# Patient Record
Sex: Female | Born: 1955 | Race: White | Hispanic: No | Marital: Married | Smoking: Never smoker
Health system: Southern US, Community
[De-identification: ages and names within clinical notes are randomized; demographics above are authoritative.]

## PROBLEM LIST (undated history)

## (undated) DIAGNOSIS — F419 Anxiety disorder, unspecified: Secondary | ICD-10-CM

## (undated) DIAGNOSIS — R600 Localized edema: Secondary | ICD-10-CM

## (undated) DIAGNOSIS — E119 Type 2 diabetes mellitus without complications: Secondary | ICD-10-CM

## (undated) DIAGNOSIS — R011 Cardiac murmur, unspecified: Secondary | ICD-10-CM

## (undated) DIAGNOSIS — G510 Bell's palsy: Secondary | ICD-10-CM

## (undated) DIAGNOSIS — R350 Frequency of micturition: Secondary | ICD-10-CM

## (undated) DIAGNOSIS — F4310 Post-traumatic stress disorder, unspecified: Secondary | ICD-10-CM

## (undated) DIAGNOSIS — I219 Acute myocardial infarction, unspecified: Secondary | ICD-10-CM

## (undated) DIAGNOSIS — C50412 Malignant neoplasm of upper-outer quadrant of left female breast: Principal | ICD-10-CM

## (undated) DIAGNOSIS — J189 Pneumonia, unspecified organism: Secondary | ICD-10-CM

## (undated) DIAGNOSIS — G4730 Sleep apnea, unspecified: Secondary | ICD-10-CM

## (undated) DIAGNOSIS — I10 Essential (primary) hypertension: Secondary | ICD-10-CM

## (undated) DIAGNOSIS — E785 Hyperlipidemia, unspecified: Secondary | ICD-10-CM

## (undated) DIAGNOSIS — M1990 Unspecified osteoarthritis, unspecified site: Secondary | ICD-10-CM

## (undated) DIAGNOSIS — F329 Major depressive disorder, single episode, unspecified: Secondary | ICD-10-CM

## (undated) DIAGNOSIS — F32A Depression, unspecified: Secondary | ICD-10-CM

## (undated) DIAGNOSIS — K219 Gastro-esophageal reflux disease without esophagitis: Secondary | ICD-10-CM

## (undated) HISTORY — DX: Hyperlipidemia, unspecified: E78.5

## (undated) HISTORY — DX: Unspecified osteoarthritis, unspecified site: M19.90

## (undated) HISTORY — DX: Depression, unspecified: F32.A

## (undated) HISTORY — PX: FRACTURE SURGERY: SHX138

## (undated) HISTORY — DX: Gastro-esophageal reflux disease without esophagitis: K21.9

## (undated) HISTORY — DX: Essential (primary) hypertension: I10

## (undated) HISTORY — DX: Major depressive disorder, single episode, unspecified: F32.9

## (undated) HISTORY — PX: COLONOSCOPY: SHX174

## (undated) HISTORY — DX: Anxiety disorder, unspecified: F41.9

## (undated) HISTORY — DX: Malignant neoplasm of upper-outer quadrant of left female breast: C50.412

---

## 2007-12-30 ENCOUNTER — Ambulatory Visit: Payer: Self-pay | Admitting: Internal Medicine

## 2007-12-30 DIAGNOSIS — F418 Other specified anxiety disorders: Secondary | ICD-10-CM

## 2007-12-30 DIAGNOSIS — I10 Essential (primary) hypertension: Secondary | ICD-10-CM

## 2007-12-30 DIAGNOSIS — R21 Rash and other nonspecific skin eruption: Secondary | ICD-10-CM

## 2007-12-30 DIAGNOSIS — J309 Allergic rhinitis, unspecified: Secondary | ICD-10-CM | POA: Insufficient documentation

## 2007-12-31 LAB — CONVERTED CEMR LAB
BUN: 15 mg/dL (ref 6–23)
Basophils Absolute: 0 10*3/uL (ref 0.0–0.1)
Calcium: 9.3 mg/dL (ref 8.4–10.5)
Direct LDL: 143.2 mg/dL
Eosinophils Relative: 1.9 % (ref 0.0–5.0)
HCT: 42.3 % (ref 36.0–46.0)
HDL: 53.1 mg/dL (ref >39.0)
Hemoglobin: 14 g/dL (ref 12.0–15.0)
Leukocytes, UA: NEGATIVE
Lymphocytes Relative: 39.9 % (ref 12.0–46.0)
MCHC: 33.2 g/dL (ref 30.0–36.0)
MCV: 91.4 fL (ref 78.0–100.0)
Monocytes Relative: 7.5 % (ref 3.0–11.0)
Neutrophils Relative %: 50.3 % (ref 43.0–77.0)
Potassium: 3 meq/L — ABNORMAL LOW (ref 3.5–5.1)
RBC: 4.62 M/uL (ref 3.87–5.11)
RDW: 13.3 % (ref 11.5–14.6)
Sodium: 139 meq/L (ref 135–145)
Specific Gravity, Urine: >=1.03 (ref 1.000–1.03)
TSH: 2.72 microintl units/mL (ref 0.35–5.50)
Total Bilirubin: 0.8 mg/dL (ref 0.3–1.2)
Total Protein, Urine: NEGATIVE mg/dL
Triglycerides: 336 mg/dL (ref 0–149)
Urine Glucose: NEGATIVE mg/dL
VLDL: 67 mg/dL — ABNORMAL HIGH (ref 0–40)
pH: 6 (ref 5.0–8.0)

## 2008-01-01 ENCOUNTER — Encounter: Payer: Self-pay | Admitting: Internal Medicine

## 2008-01-01 DIAGNOSIS — E559 Vitamin D deficiency, unspecified: Secondary | ICD-10-CM

## 2008-01-06 LAB — CONVERTED CEMR LAB: Vit D, 1,25-Dihydroxy: 19 — ABNORMAL LOW (ref 30–89)

## 2008-01-08 ENCOUNTER — Telehealth: Payer: Self-pay | Admitting: Internal Medicine

## 2008-01-09 ENCOUNTER — Ambulatory Visit: Payer: Self-pay | Admitting: Family Medicine

## 2008-01-09 DIAGNOSIS — J069 Acute upper respiratory infection, unspecified: Secondary | ICD-10-CM | POA: Insufficient documentation

## 2008-01-09 DIAGNOSIS — J019 Acute sinusitis, unspecified: Secondary | ICD-10-CM

## 2008-01-14 ENCOUNTER — Telehealth: Payer: Self-pay | Admitting: Internal Medicine

## 2008-01-18 ENCOUNTER — Encounter: Payer: Self-pay | Admitting: Internal Medicine

## 2008-01-18 ENCOUNTER — Telehealth: Payer: Self-pay | Admitting: Internal Medicine

## 2008-02-02 ENCOUNTER — Encounter: Payer: Self-pay | Admitting: Internal Medicine

## 2008-05-04 ENCOUNTER — Encounter: Payer: Self-pay | Admitting: Internal Medicine

## 2008-09-25 ENCOUNTER — Emergency Department (HOSPITAL_COMMUNITY): Admission: EM | Admit: 2008-09-25 | Discharge: 2008-09-25 | Payer: Self-pay | Admitting: Emergency Medicine

## 2008-12-23 ENCOUNTER — Inpatient Hospital Stay (HOSPITAL_COMMUNITY): Admission: EM | Admit: 2008-12-23 | Discharge: 2008-12-28 | Payer: Self-pay | Admitting: Emergency Medicine

## 2008-12-23 HISTORY — PX: ANKLE FRACTURE SURGERY: SHX122

## 2008-12-23 HISTORY — PX: ORIF TIBIA & FIBULA FRACTURES: SHX2131

## 2008-12-26 ENCOUNTER — Encounter (INDEPENDENT_AMBULATORY_CARE_PROVIDER_SITE_OTHER): Payer: Self-pay | Admitting: Internal Medicine

## 2008-12-26 ENCOUNTER — Ambulatory Visit: Payer: Self-pay | Admitting: Vascular Surgery

## 2010-04-11 HISTORY — PX: LAPAROSCOPIC CHOLECYSTECTOMY: SUR755

## 2010-04-25 ENCOUNTER — Ambulatory Visit (HOSPITAL_COMMUNITY): Admission: RE | Admit: 2010-04-25 | Discharge: 2010-04-25 | Payer: Self-pay | Admitting: Family Medicine

## 2010-04-25 ENCOUNTER — Ambulatory Visit: Payer: Self-pay | Admitting: Gastroenterology

## 2010-04-25 ENCOUNTER — Inpatient Hospital Stay (HOSPITAL_COMMUNITY): Admission: AD | Admit: 2010-04-25 | Discharge: 2010-04-28 | Payer: Self-pay

## 2010-04-25 ENCOUNTER — Ambulatory Visit: Payer: Self-pay | Admitting: Family Medicine

## 2010-04-25 DIAGNOSIS — R5381 Other malaise: Secondary | ICD-10-CM

## 2010-04-25 DIAGNOSIS — E785 Hyperlipidemia, unspecified: Secondary | ICD-10-CM | POA: Insufficient documentation

## 2010-04-25 DIAGNOSIS — R5383 Other fatigue: Secondary | ICD-10-CM

## 2010-04-25 DIAGNOSIS — R7303 Prediabetes: Secondary | ICD-10-CM

## 2010-04-25 DIAGNOSIS — R1011 Right upper quadrant pain: Secondary | ICD-10-CM | POA: Insufficient documentation

## 2010-04-27 ENCOUNTER — Encounter: Payer: Self-pay | Admitting: Physician Assistant

## 2010-04-27 ENCOUNTER — Encounter (INDEPENDENT_AMBULATORY_CARE_PROVIDER_SITE_OTHER): Payer: Self-pay | Admitting: Internal Medicine

## 2010-04-28 ENCOUNTER — Encounter: Payer: Self-pay | Admitting: Physician Assistant

## 2010-05-01 ENCOUNTER — Encounter: Payer: Self-pay | Admitting: Physician Assistant

## 2010-05-07 ENCOUNTER — Encounter: Payer: Self-pay | Admitting: Physician Assistant

## 2010-05-07 ENCOUNTER — Ambulatory Visit: Payer: Self-pay | Admitting: Family Medicine

## 2010-05-07 DIAGNOSIS — R748 Abnormal levels of other serum enzymes: Secondary | ICD-10-CM | POA: Insufficient documentation

## 2010-05-10 ENCOUNTER — Encounter: Payer: Self-pay | Admitting: Physician Assistant

## 2010-05-10 LAB — CONVERTED CEMR LAB
ALT: 12 units/L (ref 0–35)
Alkaline Phosphatase: 112 units/L (ref 39–117)
Indirect Bilirubin: 0.2 mg/dL (ref 0.0–0.9)
Total Protein: 7.6 g/dL (ref 6.0–8.3)

## 2010-06-27 ENCOUNTER — Encounter: Payer: Self-pay | Admitting: Gastroenterology

## 2010-07-24 ENCOUNTER — Ambulatory Visit: Payer: Self-pay | Admitting: Family Medicine

## 2010-07-24 DIAGNOSIS — N950 Postmenopausal bleeding: Secondary | ICD-10-CM

## 2010-08-30 ENCOUNTER — Encounter: Payer: Self-pay | Admitting: Family Medicine

## 2010-10-19 ENCOUNTER — Telehealth: Payer: Self-pay | Admitting: Family Medicine

## 2010-10-23 ENCOUNTER — Ambulatory Visit: Payer: Self-pay | Admitting: Family Medicine

## 2010-11-19 ENCOUNTER — Ambulatory Visit
Admission: RE | Admit: 2010-11-19 | Discharge: 2010-11-19 | Payer: Self-pay | Source: Home / Self Care | Attending: Family Medicine | Admitting: Family Medicine

## 2010-11-19 DIAGNOSIS — J018 Other acute sinusitis: Secondary | ICD-10-CM | POA: Insufficient documentation

## 2010-11-19 DIAGNOSIS — J329 Chronic sinusitis, unspecified: Secondary | ICD-10-CM | POA: Insufficient documentation

## 2010-11-19 DIAGNOSIS — J209 Acute bronchitis, unspecified: Secondary | ICD-10-CM | POA: Insufficient documentation

## 2010-11-22 ENCOUNTER — Telehealth: Payer: Self-pay | Admitting: Family Medicine

## 2010-12-02 ENCOUNTER — Encounter: Payer: Self-pay | Admitting: Family Medicine

## 2010-12-11 NOTE — Assessment & Plan Note (Signed)
Summary: NEW PATIENT- room 2   Vital Signs:  Patient profile:   55 year old female Height:      65.75 inches Weight:      214.50 pounds BMI:     35.01 O2 Sat:      98 % on Room air Pulse rate:   69 / minute Resp:     16 per minute BP sitting:   150 / 90  (left arm)  Vitals Entered By: Adella Hare LPN (April 25, 2010 8:40 AM) CC: new patient Is Patient Diabetic? No Pain Assessment Patient in pain? yes     Location: right side pain Intensity: 2 Type: aching Onset of pain  Constant   CC:  new patient.  History of Present Illness: New pt here to establish care with new PCP.  Pt states she has had intermittent pain x 1 yr RUQ of abd.  Had a flare up last night and is still painful today. Though not as bad as last night.   Was feverish, & chills.  Decreased appetite and nauseated during the attacks.  BM's nl.  No increase in flatulance  Hx of htn. Taking medications as prescribed. No chest pain or palpitations. BP at home running little higher than previous for the last couple of mos.  At home 130's over upper 80's 90's.  Has been getting some HA's again.  Hx of depression.  Wellbutrin not working well.  Cymbalta was better but had sexual side effects.  Lexapro seemed to work the best but uncertain why was changed to Cymbalta.  Uses Xanax as needed  at bedtime only.  She does not take it every night.  Hx of seasonal allergies.  Allegra did not help.  Sxs have improved recently.  Last physical and labs approx 1 yr ago.  Has been on HRT x approx 1 yr. Aware of the risks.  Discussed with pt that we may want to change her to topical estrogen.  Will discuss this more when she comes in for her physical.  Last pap & mamm approx 3 yrs ago.  Pt states her blood sugar tends to run around 140.    Current Medications (verified): 1)  Atenolol 50 Mg Tabs (Atenolol) .... One Tab By Mouth Once Daily 2)  Bupropion Hcl 150 Mg Xr24h-Tab (Bupropion Hcl) .... One Tab By Mouth Two Times A Day 3)   Alprazolam 0.5 Mg Tabs (Alprazolam) .... One Tab By Mouth At Bedtime As Needed For Sleep 4)  Aspir-Low 81 Mg Tbec (Aspirin) .... One Tab By Mouth Once Daily 5)  Prempro 0.3-1.5 Mg Tabs (Conj Estrog-Medroxyprogest Ace) .... One Tab By Mouth Once Daily  Allergies (verified): 1)  Cymbalta (Duloxetine Hcl)  Past History:  Past medical, surgical, family and social histories (including risk factors) reviewed, and no changes noted (except as noted below).  Past Medical History: Reviewed history from 12/30/2007 and no changes required. Allergic rhinitis Depression Hypertension H. Zoster  Past Surgical History: 12/23/08 broken leg- had to be pinned  Family History: Reviewed history from 12/30/2007 and no changes required. mother deceased-lung cancer father deceased- MVA, alcoholic two sisters living- Htnx1, depression x2 three brothers living- Heart dzx 2, Htn x1, DM x1 one brother deceased- esoph cancer  Social History: Reviewed history from 12/30/2007 and no changes required. 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 ENT:  Denies nasal congestion and sore throat. CV:  Denies chest pain or discomfort and palpitations. Resp:  Denies cough and shortness of breath. GI:  Complains of abdominal pain, loss of appetite, and nausea; denies change in bowel habits, indigestion, and vomiting. GU:  Denies abnormal vaginal bleeding, dysuria, and urinary frequency. Psych:  Complains of anxiety and depression; denies suicidal thoughts/plans and thoughts /plans of harming others.  Physical Exam  General:  Well-developed,well-nourished,in no acute distress; alert,appropriate and cooperative throughout examination Head:  Normocephalic and atraumatic without obvious abnormalities. No apparent alopecia or  balding. Ears:  External ear exam shows no significant lesions or deformities.  Otoscopic examination reveals clear canals, tympanic membranes are intact bilaterally without bulging, retraction, inflammation or discharge. Hearing is grossly normal bilaterally. Nose:  External nasal examination shows no deformity or inflammation. Nasal mucosa are pink and moist without lesions or exudates. Mouth:  Oral mucosa and oropharynx without lesions or exudates.  Teeth in good repair. Neck:  No deformities, masses, or tenderness noted. Lungs:  Normal respiratory effort, chest expands symmetrically. Lungs are clear to auscultation, no crackles or wheezes. Heart:  Normal rate and regular rhythm. S1 and S2 normal without gallop, murmur, click, rub or other extra sounds. Abdomen:  soft, normal bowel sounds, no distention, no masses, no abdominal hernia, no hepatomegaly, and no splenomegaly.  Pt is very TTP RUQ at CVA with guarding. Cervical Nodes:  No lymphadenopathy noted Psych:  Cognition and judgment appear intact. Alert and cooperative with normal attention span and concentration. No apparent delusions, illusions, hallucinationsnot anxious appearing and not depressed appearing.     Impression & Recommendations:  Problem # 1:  ABDOMINAL PAIN, RIGHT UPPER QUADRANT (ICD-789.01) Assessment New Discussed pain is probably gallbladder.  To avoid greasy or fatty foods.  Light bland diet.  Abd [place] scheduled for today. Advised pt that if her pain worsens she will need to go to ER.  Orders: T-CBC w/Diff ([id number]) T-Comprehensive Metabolic Panel ([id number]) Abdomen Ultrasound (Sono Abd)  Problem # 2:  HYPERTENSION (ICD-401.9) Assessment: Comment Only Will monitor.  BP is up today but pt is also having pain.  The following medications were removed from the medication list:    Atenolol-chlorthalidone 50-25 Mg Tabs (Atenolol-chlorthalidone) .Marland Kitchen... 1 by mouth every day Her updated medication list for this  problem includes:    Atenolol 50 Mg Tabs (Atenolol) ..... One tab by mouth once daily  Orders: T-Comprehensive Metabolic Panel ([id number])  BP today: 150/90 Prior BP: 110/70 (01/09/2008)  Labs Reviewed: K+: 3.0 (12/30/2007) Creat: : 0.9 (12/30/2007)   Chol: 278 (12/30/2007)   HDL: 53.1 (12/30/2007)   LDL: DEL (12/30/2007)   TG: 336 (12/30/2007)  Problem # 3:  DEPRESSION (ICD-311) Assessment: Deteriorated  Add Lexapro to Wellbutrin.  The following medications were removed from the medication list:    Celexa 20 Mg Tabs (Citalopram hydrobromide) .Marland Kitchen... 1 by mouth once daily. start with 1/2 tab a day x 1 wk.    Lorazepam 0.5 Mg Tabs (Lorazepam) .Marland Kitchen... 1 by mouth two times a day as needed anxiety Her updated medication list for this problem includes:    Bupropion Hcl 150 Mg Xr24h-tab (Bupropion hcl) ..... One tab by mouth two times a day    Alprazolam 0.5 Mg Tabs (Alprazolam) ..... One tab by mouth at bedtime as needed for sleep    Lexapro 10 Mg Tabs (Escitalopram oxalate) .Marland Kitchen... Take 1 daily  Problem # 4:  OBESITY (ICD-278.00) Assessment: Comment Only Encouraged exercise, healthy diet and  wt loss.  Complete Medication List: 1)  Atenolol 50 Mg Tabs (Atenolol) .... One tab by mouth once daily 2)  Bupropion Hcl 150 Mg Xr24h-tab (Bupropion hcl) .... One tab by mouth two times a day 3)  Alprazolam 0.5 Mg Tabs (Alprazolam) .... One tab by mouth at bedtime as needed for sleep 4)  Aspir-low 81 Mg Tbec (Aspirin) .... One tab by mouth once daily 5)  Prempro 0.3-1.5 Mg Tabs (Conj estrog-medroxyprogest ace) .... One tab by mouth once daily 6)  Lexapro 10 Mg Tabs (Escitalopram oxalate) .... Take 1 daily 7)  Hydrocodone-acetaminophen 5-500 Mg Tabs (Hydrocodone-acetaminophen) .... Take 1 every 4-6 hrs as needed for pain.  Other Orders: T-TSH [id number]) T-Lipid Profile [phone]) T- Hemoglobin A1C [phone]) T-Vitamin D (25-Hydroxy) [phone])  Patient Instructions: 1)  Follow up  appt for physical in 1-2 months. 2)  I have ordered blood work for you to have drawn this morning. 3)  I have ordered a ultrasound of your abdomen. 4)  I have added Lexapro. 5)  I have refilled your other medications. 6)  It is important that you exercise regularly at least 20 minutes 5 times a week. If you develop chest pain, have severe difficulty breathing, or feel very tired , stop exercising immediately and seek medical attention. 7)  You need to lose weight. Consider a lower calorie diet and regular exercise.  Prescriptions: HYDROCODONE-ACETAMINOPHEN 5-500 MG TABS (HYDROCODONE-ACETAMINOPHEN) take 1 every 4-6 hrs as needed for pain.  #30 x 0   Entered and Authorized by:   Esperanza Sheets PA   Signed by:   Esperanza Sheets PA on 04/25/2010   Method used:   Printed then faxed to ...       [facility] (retail)       [address]       [place], [place]  [address]       Ph: [phone]       Fax: [phone]   RxID:   [id number] ALPRAZOLAM 0.5 MG TABS (ALPRAZOLAM) one tab by mouth at bedtime as needed for sleep  #30 x 1   Entered and Authorized by:   Esperanza Sheets PA   Signed by:   Esperanza Sheets PA on 04/25/2010   Method used:   Printed then faxed to ...       [facility] (retail)       [address]       [place], [place]  [address]       Ph: [phone]       Fax: [phone]   RxID:   [id number] PREMPRO 0.3-1.5 MG TABS (CONJ ESTROG-MEDROXYPROGEST ACE) one tab by mouth once daily  #30 x 3   Entered and Authorized by:   Esperanza Sheets PA   Signed by:   Esperanza Sheets PA on 04/25/2010   Method used:   Electronically to        [facility] (retail)       [address]       [place], [place]  [address]       Ph: [phone]       Fax: [phone]   RxID:   [id number] BUPROPION HCL 150 MG XR24H-TAB (BUPROPION HCL) one tab by mouth two times a day  #60 x 3    Entered and Authorized by:   Esperanza Sheets PA   Signed  by:   Esperanza Sheets PA on 04/25/2010   Method used:   Electronically to        [facility] (retail)       [address]       [place], [place]  [address]       Ph: [phone]       Fax: [phone]   RxID:   [id number] ATENOLOL 50 MG TABS (ATENOLOL) one tab by mouth once daily  #30 x 3   Entered and Authorized by:   Esperanza Sheets PA   Signed by:   Esperanza Sheets PA on 04/25/2010   Method used:   Electronically to        [facility] (retail)       [address]       [place], [place]  [address]       Ph: [phone]       Fax: [phone]   RxID:   [id number] LEXAPRO 10 MG TABS (ESCITALOPRAM OXALATE) take 1 daily  #30 x 3   Entered and Authorized by:   Esperanza Sheets PA   Signed by:   Esperanza Sheets PA on 04/25/2010   Method used:   Electronically to        [facility] (retail)       [address]       [place], [place]  [address]       Ph: [phone]       Fax: [phone]   RxID:   [id number]

## 2010-12-11 NOTE — Letter (Signed)
Summary: Letter  Letter   Imported By: Lind Guest 05/11/2010 08:49:44  _____________________________________________________________________  External Attachment:    Type:   Image     Comment:   External Document

## 2010-12-11 NOTE — Letter (Signed)
Summary: Discharge Summary  Discharge Summary   Imported By: Lind Guest 05/02/2010 13:46:53  _____________________________________________________________________  External Attachment:    Type:   Image     Comment:   External Document

## 2010-12-11 NOTE — Letter (Signed)
Summary: RADIOLOGY REPORT [facility]  RADIOLOGY REPORT [facility]   Imported By: Rexene Alberts 06/27/2010 16:50:36  _____________________________________________________________________  External Attachment:    Type:   Image     Comment:   External Document

## 2010-12-11 NOTE — Assessment & Plan Note (Signed)
Summary: ov   Vital Signs:  Patient profile:   55 year old female Weight:      219.25 pounds O2 Sat:      98 % on Room air Pulse rate:   68 / minute BP sitting:   130 / 78  (left arm) CC: routine check up, had gall bladder surgery in June, also needs medication refills.  she has questions about hormonal supplement, she has started a period a week and a half ago,  room 3 Is Patient Diabetic? No   CC:  routine check up, had gall bladder surgery in June, also needs medication refills.  she has questions about hormonal supplement, she has started a period a week and a half ago, and room 3.  History of Present Illness: Had episode of spotting while inpt for gallbladder in June and was off HRT.  Stopped as soon as discharged from hosp & restarted HRT.  Had been at least a yr prior to that since had vag bleeding.  Started a period 1 1/2 weeks ago, heavy flow, some clots, and is still bleeding.  Increased appetite and hot flashes.  Changing protection every 2-3 hrs.  Has not missed HRT etc.  Also needs refill of her other meds. States she is doing very well emotionally with the Lexapro and Wellbutrin. Started chol medication in June.  Is due for labs. Hx of htn. Taking medications as prescribed and denies side effects.  No headache, chest pain or palpitations.       Allergies: 1)  Cymbalta (Duloxetine Hcl)  Past History:  PMH reviewed and updated.  Past Medical History: Allergic rhinitis Depression Hypertension H. Zoster Hyperlipidemia  Review of Systems General:  Denies chills and fever. CV:  Denies chest pain or discomfort and palpitations. Resp:  Denies shortness of breath. GI:  Complains of gas; denies abdominal pain, change in bowel habits, nausea, and vomiting. GU:  Complains of abnormal vaginal bleeding; denies dysuria and urinary frequency.  Physical Exam  General:  Well-developed,well-nourished,in no acute distress; alert,appropriate and cooperative throughout  examination Head:  Normocephalic and atraumatic without obvious abnormalities. No apparent alopecia or balding. Ears:  External ear exam shows no significant lesions or deformities.  Otoscopic examination reveals clear canals, tympanic membranes are intact bilaterally without bulging, retraction, inflammation or discharge. Hearing is grossly normal bilaterally. Nose:  External nasal examination shows no deformity or inflammation. Nasal mucosa are pink and moist without lesions or exudates. Mouth:  Oral mucosa and oropharynx without lesions or exudates.  Teeth in good repair. Neck:  No deformities, masses, or tenderness noted. Lungs:  Normal respiratory effort, chest expands symmetrically. Lungs are clear to auscultation, no crackles or wheezes. Heart:  Normal rate and regular rhythm. S1 and S2 normal without gallop, murmur, click, rub or other extra sounds. Abdomen:  Bowel sounds positive,abdomen soft and non-tender without masses, organomegaly or hernias noted. Cervical Nodes:  No lymphadenopathy noted Psych:  Cognition and judgment appear intact. Alert and cooperative with normal attention span and concentration. No apparent delusions, illusions, hallucinations   Impression & Recommendations:  Problem # 1:  POSTMENOPAUSAL BLEEDING (ICD-627.1) Assessment New  Her updated medication list for this problem includes:    Prempro 0.3-1.5 Mg Tabs (Conj estrog-medroxyprogest ace) ..... One tab by mouth once daily  Orders: Pelvic Ultrasound (Sono Pelvis) T-CBC No Diff ([id number]) T-TSH ([id number]) Gynecologic Referral (Gyn)  Problem # 2:  HYPERTENSION (ICD-401.9) Assessment: Comment Only  Her updated medication list for this problem includes:    Atenolol  50 Mg Tabs (Atenolol) ..... One tab by mouth once daily  Orders: T-Comprehensive Metabolic Panel ([id number])  BP today: 130/78 Prior BP: 100/68 (05/07/2010)  Labs Reviewed: K+: 3.0 (12/30/2007) Creat: : 0.9 (12/30/2007)   Chol:  278 (12/30/2007)   HDL: 53.1 (12/30/2007)   LDL: DEL (12/30/2007)   TG: 336 (12/30/2007)  Problem # 3:  HYPERLIPIDEMIA (ICD-272.4) Assessment: Comment Only  Her updated medication list for this problem includes:    Simvastatin 20 Mg Tabs (Simvastatin) ..... One tab by mouth at bedtime  Orders: T-Comprehensive Metabolic Panel [phone]) T-Lipid Profile [phone])  Labs Reviewed: SGOT: 13 (05/07/2010)   SGPT: 12 (05/07/2010)   HDL:53.1 (12/30/2007)  LDL:DEL (12/30/2007)  Chol:278 (12/30/2007)  Trig:336 (12/30/2007)  Problem # 4:  DEPRESSION (ICD-311) Assessment: Improved  Her updated medication list for this problem includes:    Bupropion Hcl 150 Mg Xr24h-tab (Bupropion hcl) ..... One tab by mouth two times a day    Alprazolam 0.5 Mg Tabs (Alprazolam) ..... One tab by mouth at bedtime as needed for sleep    Lexapro 10 Mg Tabs (Escitalopram oxalate) .Marland Kitchen... Take 1 daily  Complete Medication List: 1)  Atenolol 50 Mg Tabs (Atenolol) .... One tab by mouth once daily 2)  Bupropion Hcl 150 Mg Xr24h-tab (Bupropion hcl) .... One tab by mouth two times a day 3)  Alprazolam 0.5 Mg Tabs (Alprazolam) .... One tab by mouth at bedtime as needed for sleep 4)  Aspir-low 81 Mg Tbec (Aspirin) .... One tab by mouth once daily 5)  Prempro 0.3-1.5 Mg Tabs (Conj estrog-medroxyprogest ace) .... One tab by mouth once daily 6)  Lexapro 10 Mg Tabs (Escitalopram oxalate) .... Take 1 daily 7)  Simvastatin 20 Mg Tabs (Simvastatin) .... One tab by mouth at bedtime  Other Orders: T- Hemoglobin A1C ([id number]) T-Mammography Bilateral Screening ([address]) Influenza Vaccine NON MCR (60109)  Patient Instructions: 1)  Please schedule a follow-up appointment in 3 months. 2)  I have referred you to a gynecologist. 3)  I have ordered a mammogram for you. 4)  I have ordered a pelvic ultrasound. 5)  I have ordered blood work.  Have this drawn fasting. 6)  I have refilled your medications. 7)  You should take a  multivit without iron daily.  Such as Centrum silver, or comparable.   8)  You should also get 1200 mg of Calcium a day.  This can be from dietary intake or calcium supplements. 9)  It is important that you exercise regularly at least 20 minutes 5 times a week. If you develop chest pain, have severe difficulty breathing, or feel very tired , stop exercising immediately and seek medical attention. 10)  You need to lose weight. Consider a lower calorie diet and regular exercise.  Prescriptions: ALPRAZOLAM 0.5 MG TABS (ALPRAZOLAM) one tab by mouth at bedtime as needed for sleep  #30 x 1   Entered and Authorized by:   Esperanza Sheets PA   Signed by:   Esperanza Sheets PA on 07/24/2010   Method used:   Printed then faxed to ...       [facility] (retail)       [address]       [place], [place]  [address]       Ph: [phone]       Fax: [phone]   RxID:   [id number] SIMVASTATIN 20 MG TABS (SIMVASTATIN) one tab by mouth at bedtime  #30 x 3  Entered and Authorized by:   Esperanza Sheets PA   Signed by:   Esperanza Sheets PA on 07/24/2010   Method used:   Electronically to        [facility] (retail)       [address]       [place], [place]  [address]       Ph: [phone]       Fax: [phone]   RxID:   [id number] LEXAPRO 10 MG TABS (ESCITALOPRAM OXALATE) take 1 daily  #30 x 3   Entered and Authorized by:   Esperanza Sheets PA   Signed by:   Esperanza Sheets PA on 07/24/2010   Method used:   Electronically to        [facility] (retail)       [address]       [place], [place]  [address]       Ph: [phone]       Fax: [phone]   RxID:   [id number] PREMPRO 0.3-1.5 MG TABS (CONJ ESTROG-MEDROXYPROGEST ACE) one tab by mouth once daily  #30 x 3   Entered and Authorized by:   Esperanza Sheets PA   Signed by:   Esperanza Sheets PA on 07/24/2010   Method used:   Electronically  to        [facility] (retail)       [address]       [place], [place]  [address]       Ph: [phone]       Fax: [phone]   RxID:   [id number] BUPROPION HCL 150 MG XR24H-TAB (BUPROPION HCL) one tab by mouth two times a day  #60 x 3   Entered and Authorized by:   Esperanza Sheets PA   Signed by:   Esperanza Sheets PA on 07/24/2010   Method used:   Electronically to        [facility] (retail)       [address]       [place], [place]  [address]       Ph: [phone]       Fax: [phone]   RxID:   [id number] ATENOLOL 50 MG TABS (ATENOLOL) one tab by mouth once daily  #30 x 3   Entered and Authorized by:   Esperanza Sheets PA   Signed by:   Esperanza Sheets PA on 07/24/2010   Method used:   Electronically to        [facility] (retail)       [address]       [place], [place]  [address]       Ph: [phone]       Fax: [phone]   RxID:   [id number]    Influenza Vaccine    Vaccine Type: Fluvax Non-MCR    Site: left deltoid    Mfr: novartis    Dose: 0.5 ml    Route: IM    Given by: Adella Hare LPN    Exp. Date: 03/2011    Lot #: [id number]    VIS given: 06/05/10 version given July 24, 2010.

## 2010-12-11 NOTE — Assessment & Plan Note (Signed)
Summary: F UP FROM HOSPITAL- ROOM 2   Vital Signs:  Patient profile:   55 year old female Height:      65.75 inches Weight:      211 pounds O2 Sat:      98 % on Room air Pulse rate:   62 / minute Resp:     16 per minute BP sitting:   100 / 68  (left arm)  Vitals Entered By: Adella Hare LPN (May 07, 2010 9:46 AM) CC: FOLLOW UP Is Patient Diabetic? No Pain Assessment Patient in pain? no      Comments DID NOT BRING MEDS TO OV   CC:  FOLLOW UP.  History of Present Illness: Pt is here for hosp follow up.  She states she is feeling better. Appetite is good.  Abd pain/discomfort is improving.  She has been having some constipation, but discontinued her pain meds and seems to be improving.  She had hyperlipidemia noted while inpt.  Needs f/u liver enzymes before rxing meds. Pt restarted her Prempro this am.  She did have a menses while off of it. She has completed her antibiotic.  Pt has already noticed improvement with Lexapro in addition to Wellbutrin.  Has been on this for only 1 week.    Allergies (verified): 1)  Cymbalta (Duloxetine Hcl)  Past History:  Past medical, surgical, family and social histories (including risk factors) reviewed, and no changes noted (except as noted below).  Past Medical History: Reviewed history from 12/30/2007 and no changes required. Allergic rhinitis Depression Hypertension H. Zoster  Past Surgical History: 12/23/08 broken leg- had to be pinned Cholecystectomy 6/11  Family History: Reviewed history from 04/25/2010 and no changes required. mother deceased-lung cancer father deceased- MVA, alcoholic two sisters living- Htnx1, depression x2 three brothers living- Heart dzx 2, Htn x1, DM x1 one brother deceased- esoph cancer  Social History: Reviewed history from 04/25/2010 and no changes required. 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 CV:  Denies chest pain or discomfort and palpitations. Resp:  Denies shortness of breath. GI:  Complains of constipation; denies abdominal pain, loss of appetite, nausea, and vomiting. GU:  Complains of abnormal vaginal bleeding. Psych:  Complains of depression.  Physical Exam  General:  Well-developed,well-nourished,in no acute distress; alert,appropriate and cooperative throughout examination Head:  Normocephalic and atraumatic without obvious abnormalities. No apparent alopecia or balding. Ears:  External ear exam shows no significant lesions or deformities.  Otoscopic examination reveals clear canals, tympanic membranes are intact bilaterally without bulging, retraction, inflammation or discharge. Hearing is grossly normal bilaterally. Nose:  External nasal examination shows no deformity or inflammation. Nasal mucosa are pink and moist without lesions or exudates. Mouth:  Oral mucosa and oropharynx without lesions or exudates.  Teeth in good repair. Neck:  No deformities, masses, or tenderness noted. Lungs:  Normal respiratory effort, chest expands symmetrically. Lungs are clear to auscultation, no crackles or wheezes. Heart:  Normal rate and regular rhythm. S1 and S2 normal without gallop, murmur, click, rub or other extra sounds. Abdomen:  soft, non-tender, and no masses.  Her incision sites do appear to be healing well.  Minimal erythema at umbilical incision without drainage. Cervical Nodes:  No lymphadenopathy noted Psych:  Cognition and judgment appear intact. Alert and cooperative with normal attention span and concentration. No apparent delusions, illusions, hallucinations   Impression & Recommendations:  Problem #  1:  OTHER NONSPECIFIC ABNORMAL SERUM ENZYME LEVELS (ICD-790.5) Assessment Comment Only Discussed with pt probably due to her cholecystitis.  Will have f/u labs drawn today.  Problem # 2:   HYPERLIPIDEMIA (ICD-272.4) Assessment: Comment Only Await results of LFT's. If WNL then will prescription statin for pt.  Problem # 3:  DEPRESSION (ICD-311) Assessment: Improved  Her updated medication list for this problem includes:    Bupropion Hcl 150 Mg Xr24h-tab (Bupropion hcl) ..... One tab by mouth two times a day    Alprazolam 0.5 Mg Tabs (Alprazolam) ..... One tab by mouth at bedtime as needed for sleep    Lexapro 10 Mg Tabs (Escitalopram oxalate) .Marland Kitchen... Take 1 daily  Problem # 4:  OBESITY (ICD-278.00) Assessment: Improved Encouraged pt to continue with wt loss. Ht: 65.75 (05/07/2010)   Wt: 211 (05/07/2010)   BMI: 35.01 (04/25/2010)  Complete Medication List: 1)  Atenolol 50 Mg Tabs (Atenolol) .... One tab by mouth once daily 2)  Bupropion Hcl 150 Mg Xr24h-tab (Bupropion hcl) .... One tab by mouth two times a day 3)  Alprazolam 0.5 Mg Tabs (Alprazolam) .... One tab by mouth at bedtime as needed for sleep 4)  Aspir-low 81 Mg Tbec (Aspirin) .... One tab by mouth once daily 5)  Prempro 0.3-1.5 Mg Tabs (Conj estrog-medroxyprogest ace) .... One tab by mouth once daily 6)  Lexapro 10 Mg Tabs (Escitalopram oxalate) .... Take 1 daily 7)  Hydrocodone-acetaminophen 5-500 Mg Tabs (Hydrocodone-acetaminophen) .... Take 1 every 4-6 hrs as needed for pain.  Other Orders: T-Hepatic Function [id number])  Patient Instructions: 1)  Reschedule appt from Aug to 1st week of Sept. 2)  Have your blood work drawn today. 3)  I will order your cholesterol medication after I see you lab test results. 4)  Continue your current medications.  We will further discuss your hormone prescription in the future. 5)  It is important that you exercise regularly at least 20 minutes 5 times a week. If you develop chest pain, have severe difficulty breathing, or feel very tired , stop exercising immediately and seek medical attention. 6)  You need to lose weight. Consider a lower calorie diet and regular  exercise.

## 2010-12-11 NOTE — Letter (Signed)
Summary: CONSULTATION FROM06/15/11  CONSULTATION FROM06/15/11   Imported By: Rexene Alberts 06/27/2010 16:41:47  _____________________________________________________________________  External Attachment:    Type:   Image     Comment:   External Document

## 2010-12-11 NOTE — Letter (Signed)
Summary: Letter to resch. appt.  Letter to resch. appt.   Imported By: Curtis Sites 08/31/2010 15:20:04  _____________________________________________________________________  External Attachment:    Type:   Image     Comment:   External Document

## 2010-12-11 NOTE — Letter (Signed)
Summary: Letter  Letter   Imported By: Lind Guest 05/01/2010 14:11:03  _____________________________________________________________________  External Attachment:    Type:   Image     Comment:   External Document

## 2010-12-11 NOTE — Progress Notes (Signed)
Summary: MEDICATION  Phone Note Call from Patient   Summary of Call: PATIENT REQUEST FOR REFILL ON RX FOR ALPRAZOLAM 0.5 MG, BUPROPION HCL 150 MG @ [facility] Initial call taken by: Eugenio Hoes,  October 19, 2010 8:52 AM    Prescriptions: BUPROPION HCL 150 MG XR24H-TAB (BUPROPION HCL) one tab by mouth two times a day  #60 x 1   Entered by:   Adella Hare LPN   Authorized by:   Syliva Overman MD   Signed by:   Adella Hare LPN on 96/29/5284   Method used:   Printed then faxed to ...       [facility] (retail)       [address]       [place], [place]  [address]       Ph: [phone]       Fax: [phone]   RxID:   [id number] ALPRAZOLAM 0.5 MG TABS (ALPRAZOLAM) one tab by mouth at bedtime as needed for sleep  #30 x 1   Entered by:   Adella Hare LPN   Authorized by:   Syliva Overman MD   Signed by:   Adella Hare LPN on 41/66/0630   Method used:   Printed then faxed to ...       [facility] (retail)       [address]       [place], [place]  [address]       Ph: [phone]       Fax: [phone]   RxID:   [id number]

## 2010-12-13 NOTE — Assessment & Plan Note (Signed)
Summary: OV   Vital Signs:  Patient profile:   55 year old female Height:      65.5 inches Weight:      230.25 pounds BMI:     37.87 O2 Sat:      97 % Pulse rate:   64 / minute Pulse rhythm:   regular Resp:     16 per minute BP sitting:   150 / 98  (left arm) Cuff size:   large  Vitals Entered By: Everitt Amber LPN (November 19, 2010 3:33 PM)  Nutrition Counseling: Patient's BMI is greater than 25 and therefore counseled on weight management options. CC: Follow up chronic problems, sinus pressure and a constant headache, weak feeling, coughing up yellowish phlegm. Going on for a couple weeks   CC:  Follow up chronic problems, sinus pressure and a constant headache, weak feeling, and coughing up yellowish phlegm. Going on for a couple weeks.  History of Present Illness: 3 week h/o fatigue, chills intermittent fever, associated with headaches as well as head and chest congestion. Pt states her chronic health status is otherwise unchanged and stable. She is frustrated about her inablitiy to lose weight, hiowever admits to excessive cal intake of unhealthy foods, as well as less activity in recent times since she has been ill.  Current Medications (verified): 1)  Atenolol 50 Mg Tabs (Atenolol) .... One Tab By Mouth Once Daily 2)  Bupropion Hcl 150 Mg Xr24h-Tab (Bupropion Hcl) .... One Tab By Mouth Two Times A Day 3)  Alprazolam 0.5 Mg Tabs (Alprazolam) .... One Tab By Mouth At Bedtime As Needed For Sleep 4)  Aspir-Low 81 Mg Tbec (Aspirin) .... One Tab By Mouth Once Daily 5)  Prempro 0.3-1.5 Mg Tabs (Conj Estrog-Medroxyprogest Ace) .... One Tab By Mouth Once Daily 6)  Simvastatin 20 Mg Tabs (Simvastatin) .... One Tab By Mouth At Bedtime  Allergies (verified): 1)  Cymbalta (Duloxetine Hcl)  Past History:  Past medical, surgical, family and social histories (including risk factors) reviewed for relevance to current acute and chronic problems.  Past Medical History: Reviewed history  from 07/24/2010 and no changes required. Allergic rhinitis Depression Hypertension H. Zoster Hyperlipidemia  Past Surgical History: 12/23/08 broken left tib, fib and ankle leg- had to be pinned, [facility] Cholecystectomy 6/11  Family History: Reviewed history from 04/25/2010 and no changes required. mother deceased-lung cancer age 24 father deceased- MVA, alcoholic two sisters living- Htnx1, depression x2 three brothers living- Heart dzx 2, Htn x1, DM x1 one brother deceased- esoph cancer  Social History: Reviewed history from 04/25/2010 and no changes required. 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 Eyes:  Denies discharge, eye pain, and red eye. ENT:  Complains of earache, nasal congestion, postnasal drainage, sinus pressure, and sore throat; headache . CV:  Denies chest pain or discomfort, palpitations, and swelling of feet. Resp:  Complains of cough and sputum productive; green sputum x 3 weeks. GI:  Denies abdominal pain, constipation, diarrhea, nausea, and vomiting blood. GU:  Denies dysuria and urinary frequency. MS:  Denies joint pain and stiffness. Derm:  Denies itching, lesion(s), and rash. Neuro:  Complains of headaches; denies poor balance, seizures, and sensation of room spinning; associated with acute illness. Psych:  Complains of anxiety and depression; denies suicidal thoughts/plans, thoughts of violence,  and unusual visions or sounds; impropved and controlled on meds. Endo:  Denies cold intolerance, excessive hunger, excessive thirst, and excessive urination. Heme:  Denies abnormal bruising and bleeding. Allergy:  Denies hives or rash and itching eyes.  Physical Exam  General:  Well-developed,obese,in no acute distress;  alert,appropriate and cooperative throughout examination. Mildly ill appearing HEENT: No facial asymmetry,  EOMI, maxillary sinus tenderness, TM's Clear, oropharynx  pink and moist.   Chest: decreased air entry, scattered crackles and wheezes CVS: S1, S2, No murmurs, No S3.   Abd: Soft, Nontender.  MS: Adequate ROM spine, hips, shoulders and knees.  Ext: No edema.   CNS: CN 2-12 intact, power tone and sensation normal throughout.   Skin: Intact, no visible lesions or rashes.  Psych: Good eye contact, normal affect.  Memory intact, not anxious or depressed appearing.    Impression & Recommendations:  Problem # 1:  ACUTE BRONCHITIS (ICD-466.0) Assessment Comment Only  Her updated medication list for this problem includes:    Penicillin V Potassium 500 Mg Tabs (Penicillin v potassium) .Marland Kitchen... Take 1 tablet by mouth three times a day    Tessalon Perles 100 Mg Caps (Benzonatate) .Marland Kitchen... Take 1 capsule by mouth three times a day  Problem # 2:  OTHER ACUTE SINUSITIS (ICD-461.8) Assessment: Comment Only  Her updated medication list for this problem includes:    Penicillin V Potassium 500 Mg Tabs (Penicillin v potassium) .Marland Kitchen... Take 1 tablet by mouth three times a day    Tessalon Perles 100 Mg Caps (Benzonatate) .Marland Kitchen... Take 1 capsule by mouth three times a day  Problem # 3:  OBESITY (ICD-278.00) Assessment: Unchanged  Ht: 65.5 (11/19/2010)   Wt: 230.25 (11/19/2010)   BMI: 37.87 (11/19/2010) therapeutic lifestyle change discussed and encouraged  Problem # 4:  HYPERTENSION (ICD-401.9) Assessment: Deteriorated  Her updated medication list for this problem includes:    Atenolol 50 Mg Tabs (Atenolol) ..... One tab by mouth once daily Patient advised to follow low sodium diet rich in fruit and vegetables, and to commit to at least 30 minutes 5 days per week of regular exercise , to improve blood presure control.   Orders: T-Basic Metabolic Panel [id number])  BP today: 150/98 Prior BP:  130/78 (07/24/2010)  Labs Reviewed: K+: 3.0 (12/30/2007) Creat: : 0.9 (12/30/2007)   Chol: 278 (12/30/2007)   HDL: 53.1 (12/30/2007)   LDL: DEL (12/30/2007)   TG: 336 (12/30/2007)  Problem # 5:  DEPRESSION (ICD-311) Assessment: Improved  The following medications were removed from the medication list:    Lexapro 10 Mg Tabs (Escitalopram oxalate) .Marland Kitchen... Take 1 daily Her updated medication list for this problem includes:    Bupropion Hcl 150 Mg Xr24h-tab (Bupropion hcl) ..... One tab by mouth two times a day    Alprazolam 0.5 Mg Tabs (Alprazolam) ..... One tab by mouth at bedtime as needed for sleep  Complete Medication List: 1)  Atenolol 50 Mg Tabs (Atenolol) .... One tab by mouth once daily 2)  Bupropion Hcl 150 Mg Xr24h-tab (Bupropion hcl) .... One tab by mouth two times a day 3)  Alprazolam 0.5 Mg Tabs (Alprazolam) .... One tab by mouth at bedtime as needed for sleep 4)  Aspir-low 81 Mg Tbec (Aspirin) .... One tab by mouth once daily 5)  Prempro 0.3-1.5 Mg Tabs (Conj estrog-medroxyprogest ace) .... One tab by mouth once daily 6)  Simvastatin 20 Mg Tabs (Simvastatin) .... One tab by mouth at bedtime 7)  Penicillin V Potassium 500 Mg Tabs (Penicillin v  potassium) .... Take 1 tablet by mouth three times a day 8)  Tessalon Perles 100 Mg Caps (Benzonatate) .... Take 1 capsule by mouth three times a day 9)  Fluconazole 150 Mg Tabs (Fluconazole) .... Take 1 tablet by mouth once a day as needed for vaginal itch  Other Orders: T-CBC w/Diff ([id number]) T-Lipid Profile [phone]) T-Hepatic Function [id number]) T-TSH [id number]) T- Hemoglobin A1C ([id number]) T-TSH ([id number]) Gastroenterology Referral (GI)  Patient Instructions: 1)  Please schedule a follow-up appointment in 2 months. 2)  It is important that you exercise regularly at least 30 minutes 5 times a week. If you develop chest pain, have severe difficulty breathing, or feel very tired , stop exercising immediately  and seek medical attention. 3)  You need to lose weight. Consider a lower calorie diet and regular exercise.  4)  You arer being treated for sinusitis and bronchitis, meds are sent in. 5)  Fasting labs are past due pls do asap Prescriptions: FLUCONAZOLE 150 MG TABS (FLUCONAZOLE) Take 1 tablet by mouth once a day as needed for vaginal itch  #3 x 0   Entered and Authorized by:   Syliva Overman MD   Signed by:   Syliva Overman MD on 11/19/2010   Method used:   Electronically to        [facility] (retail)       [address]       [place], [place]  [address]       Ph: [phone]       Fax: [phone]   RxID:   [id number] TESSALON PERLES 100 MG CAPS (BENZONATATE) Take 1 capsule by mouth three times a day  #30 x 0   Entered and Authorized by:   Syliva Overman MD   Signed by:   Syliva Overman MD on 11/19/2010   Method used:   Electronically to        [facility] (retail)       [address]       [place], [place]  [address]       Ph: [phone]       Fax: [phone]   RxID:   [id number] PENICILLIN V POTASSIUM 500 MG TABS (PENICILLIN V POTASSIUM) Take 1 tablet by mouth three times a day  #42 x 0   Entered and Authorized by:   Syliva Overman MD   Signed by:   Syliva Overman MD on 11/19/2010   Method used:   Electronically to        [facility] (retail)       [address]       [place], [place]  [address]       Ph: [phone]       Fax: [phone]   RxID:   [id number]    Orders Added: 1)  Est. Patient Level IV [78[address]] 2)  T-Basic Metabolic Panel [[id number]] 3)  T-CBC w/Diff [[id number]] 4)  T-Lipid Profile [80061-22930] 5)  T-Hepatic Function [80076-22960] 6)  T-TSH [[id number]] 7)  T- Hemoglobin A1C [83036-23375] 8)  T-TSH [[id number]] 9)  Gastroenterology Referral [GI]

## 2010-12-13 NOTE — Progress Notes (Signed)
Summary: MEDICINE NOT WORKING  Phone Note Call from Patient   Summary of Call: WHEN SHE WAS HERE THE OTHER DAY DR HAD GAVE HER A DECONGESTION AND IT IS NOT WORKING WOULD YOU SEND IN SOMETHING ELSE CALL BACK AND LET HER KNOW CALL AT [phone] Initial call taken by: Lind Guest,  November 22, 2010 3:08 PM  Follow-up for Phone Call        patient called back in this morning, and states her headache just isn't going away. Follow-up by: Curtis Sites,  November 23, 2010 10:01 AM  Additional Follow-up for Phone Call Additional follow up Details #1::        I advised pt on the useof otc sudafed/zyrtec D, tylenol/ibuprofen, fluids, and salinee irrigation. Ed if no better. She understands and agrees Additional Follow-up by: Syliva Overman MD,  November 23, 2010 12:49 PM

## 2010-12-14 NOTE — Letter (Signed)
Summary: release of medical info  / d.luking  release of medical info  / d.luking   Imported By: Lind Guest 04/27/2010 07:50:50  _____________________________________________________________________  External Attachment:    Type:   Image     Comment:   External Document

## 2011-01-21 ENCOUNTER — Ambulatory Visit: Payer: Self-pay | Admitting: Family Medicine

## 2011-01-27 LAB — BASIC METABOLIC PANEL
CO2: 23 mEq/L (ref 19–32)
Calcium: 8.3 mg/dL — ABNORMAL LOW (ref 8.4–10.5)
Creatinine, Ser: 0.96 mg/dL (ref 0.4–1.2)
Glucose, Bld: 123 mg/dL — ABNORMAL HIGH (ref 70–99)
Sodium: 139 mEq/L (ref 135–145)

## 2011-01-27 LAB — HEPATIC FUNCTION PANEL
AST: 78 U/L — ABNORMAL HIGH (ref 0–37)
Bilirubin, Direct: 0.2 mg/dL (ref 0.0–0.3)
Indirect Bilirubin: 1 mg/dL — ABNORMAL HIGH (ref 0.3–0.9)

## 2011-01-27 LAB — CBC
HCT: 37.5 % (ref 36.0–46.0)
HCT: 40.3 % (ref 36.0–46.0)
Hemoglobin: 12.2 g/dL (ref 12.0–15.0)
Hemoglobin: 12.5 g/dL (ref 12.0–15.0)
Hemoglobin: 13.4 g/dL (ref 12.0–15.0)
Hemoglobin: 13.7 g/dL (ref 12.0–15.0)
MCHC: 33.3 g/dL (ref 30.0–36.0)
MCHC: 33.4 g/dL (ref 30.0–36.0)
MCHC: 34 g/dL (ref 30.0–36.0)
MCV: 91.6 fL (ref 78.0–100.0)
MCV: 92.8 fL (ref 78.0–100.0)
Platelets: 294 10*3/uL (ref 150–400)
RBC: 3.9 MIL/uL (ref 3.87–5.11)
RBC: 4.41 MIL/uL (ref 3.87–5.11)
RDW: 13.7 % (ref 11.5–15.5)
RDW: 13.7 % (ref 11.5–15.5)
RDW: 13.7 % (ref 11.5–15.5)
RDW: 13.8 % (ref 11.5–15.5)
WBC: 16.8 10*3/uL — ABNORMAL HIGH (ref 4.0–10.5)

## 2011-01-27 LAB — DIFFERENTIAL
Basophils Absolute: 0.1 10*3/uL (ref 0.0–0.1)
Basophils Absolute: 0.2 10*3/uL — ABNORMAL HIGH (ref 0.0–0.1)
Basophils Relative: 0 % (ref 0–1)
Basophils Relative: 0 % (ref 0–1)
Basophils Relative: 1 % (ref 0–1)
Eosinophils Absolute: 0.1 10*3/uL (ref 0.0–0.7)
Eosinophils Absolute: 0.4 10*3/uL (ref 0.0–0.7)
Eosinophils Relative: 1 % (ref 0–5)
Eosinophils Relative: 3 % (ref 0–5)
Lymphocytes Relative: 18 % (ref 12–46)
Lymphs Abs: 2.9 10*3/uL (ref 0.7–4.0)
Lymphs Abs: 3 10*3/uL (ref 0.7–4.0)
Lymphs Abs: 3.4 10*3/uL (ref 0.7–4.0)
Monocytes Absolute: 1.2 10*3/uL — ABNORMAL HIGH (ref 0.1–1.0)
Monocytes Absolute: 1.2 10*3/uL — ABNORMAL HIGH (ref 0.1–1.0)
Monocytes Absolute: 1.3 10*3/uL — ABNORMAL HIGH (ref 0.1–1.0)
Monocytes Relative: 10 % (ref 3–12)
Monocytes Relative: 7 % (ref 3–12)
Monocytes Relative: 9 % (ref 3–12)
Neutro Abs: 10.4 10*3/uL — ABNORMAL HIGH (ref 1.7–7.7)
Neutro Abs: 10.7 10*3/uL — ABNORMAL HIGH (ref 1.7–7.7)
Neutro Abs: 12.3 10*3/uL — ABNORMAL HIGH (ref 1.7–7.7)
Neutrophils Relative %: 64 % (ref 43–77)
Neutrophils Relative %: 69 % (ref 43–77)
Neutrophils Relative %: 74 % (ref 43–77)

## 2011-01-27 LAB — URINALYSIS, ROUTINE W REFLEX MICROSCOPIC
Leukocytes, UA: NEGATIVE
Nitrite: NEGATIVE
Specific Gravity, Urine: >1.03 — ABNORMAL HIGH (ref 1.005–1.030)
pH: 6 (ref 5.0–8.0)

## 2011-01-27 LAB — PHOSPHORUS: Phosphorus: 3.9 mg/dL (ref 2.3–4.6)

## 2011-01-27 LAB — LIPID PANEL
Cholesterol: 258 mg/dL — ABNORMAL HIGH (ref 0–200)
Cholesterol: 267 mg/dL — ABNORMAL HIGH (ref 0–200)
HDL: 60 mg/dL (ref >39)
LDL Cholesterol: 159 mg/dL — ABNORMAL HIGH (ref 0–99)
Total CHOL/HDL Ratio: 4.4 RATIO
Triglycerides: 208 mg/dL — ABNORMAL HIGH (ref <150)

## 2011-01-27 LAB — COMPREHENSIVE METABOLIC PANEL
ALT: 31 U/L (ref 0–35)
ALT: 82 U/L — ABNORMAL HIGH (ref 0–35)
AST: 48 U/L — ABNORMAL HIGH (ref 0–37)
AST: 68 U/L — ABNORMAL HIGH (ref 0–37)
Albumin: 3.5 g/dL (ref 3.5–5.2)
Alkaline Phosphatase: 137 U/L — ABNORMAL HIGH (ref 39–117)
Alkaline Phosphatase: 96 U/L (ref 39–117)
BUN: 17 mg/dL (ref 6–23)
BUN: 9 mg/dL (ref 6–23)
CO2: 23 mEq/L (ref 19–32)
Calcium: 8 mg/dL — ABNORMAL LOW (ref 8.4–10.5)
Calcium: 8.3 mg/dL — ABNORMAL LOW (ref 8.4–10.5)
Calcium: 8.9 mg/dL (ref 8.4–10.5)
Chloride: 105 mEq/L (ref 96–112)
Creatinine, Ser: 0.79 mg/dL (ref 0.4–1.2)
Creatinine, Ser: 0.82 mg/dL (ref 0.4–1.2)
GFR calc Af Amer: >60 mL/min (ref >60)
GFR calc Af Amer: >60 mL/min (ref >60)
GFR calc non Af Amer: >60 mL/min (ref >60)
GFR calc non Af Amer: >60 mL/min (ref >60)
Glucose, Bld: 110 mg/dL — ABNORMAL HIGH (ref 70–99)
Glucose, Bld: 121 mg/dL — ABNORMAL HIGH (ref 70–99)
Glucose, Bld: 124 mg/dL — ABNORMAL HIGH (ref 70–99)
Potassium: 3.7 mEq/L (ref 3.5–5.1)
Potassium: 3.8 mEq/L (ref 3.5–5.1)
Sodium: 136 mEq/L (ref 135–145)
Sodium: 137 mEq/L (ref 135–145)
Total Bilirubin: 1.1 mg/dL (ref 0.3–1.2)
Total Protein: 6 g/dL (ref 6.0–8.3)
Total Protein: 6.4 g/dL (ref 6.0–8.3)
Total Protein: 6.6 g/dL (ref 6.0–8.3)

## 2011-01-27 LAB — GLUCOSE, CAPILLARY: Glucose-Capillary: 130 mg/dL — ABNORMAL HIGH (ref 70–99)

## 2011-01-27 LAB — APTT: aPTT: 26 seconds (ref 24–37)

## 2011-01-27 LAB — PROTIME-INR
INR: 1.08 (ref 0.00–1.49)
Prothrombin Time: 13.9 seconds (ref 11.6–15.2)

## 2011-01-27 LAB — LIPASE, BLOOD: Lipase: 52 U/L (ref 11–59)

## 2011-01-27 LAB — MAGNESIUM
Magnesium: 2.1 mg/dL (ref 1.5–2.5)
Magnesium: 2.1 mg/dL (ref 1.5–2.5)

## 2011-01-27 LAB — TSH: TSH: 4.339 u[IU]/mL (ref 0.350–4.500)

## 2011-01-27 LAB — URINE MICROSCOPIC-ADD ON

## 2011-01-27 LAB — HEMOGLOBIN A1C
Hgb A1c MFr Bld: 6.1 % — ABNORMAL HIGH (ref <5.7)
Mean Plasma Glucose: 128 mg/dL — ABNORMAL HIGH (ref <117)

## 2011-01-27 LAB — URINE CULTURE

## 2011-02-19 ENCOUNTER — Other Ambulatory Visit: Payer: Self-pay | Admitting: Family Medicine

## 2011-02-19 LAB — CBC WITH DIFFERENTIAL/PLATELET
Basophils Absolute: 0.1 10*3/uL (ref 0.0–0.1)
Basophils Relative: 1 % (ref 0–1)
Eosinophils Absolute: 0.2 10*3/uL (ref 0.0–0.7)
Eosinophils Relative: 2 % (ref 0–5)
MCH: 30 pg (ref 26.0–34.0)
MCHC: 32.5 g/dL (ref 30.0–36.0)
MCV: 92.3 fL (ref 78.0–100.0)
Neutrophils Relative %: 57 % (ref 43–77)
Platelets: 348 10*3/uL (ref 150–400)
RBC: 4.56 MIL/uL (ref 3.87–5.11)
RDW: 13.6 % (ref 11.5–15.5)

## 2011-02-19 LAB — HEPATIC FUNCTION PANEL
ALT: 14 U/L (ref 0–35)
AST: 17 U/L (ref 0–37)
Albumin: 4 g/dL (ref 3.5–5.2)
Alkaline Phosphatase: 80 U/L (ref 39–117)
Total Bilirubin: 0.5 mg/dL (ref 0.3–1.2)

## 2011-02-19 LAB — BASIC METABOLIC PANEL
CO2: 21 mEq/L (ref 19–32)
Calcium: 9.1 mg/dL (ref 8.4–10.5)
Creat: 0.98 mg/dL (ref 0.40–1.20)
Sodium: 139 mEq/L (ref 135–145)

## 2011-02-19 LAB — LIPID PANEL
Cholesterol: 231 mg/dL — ABNORMAL HIGH (ref 0–200)
HDL: 50 mg/dL (ref >39)
Total CHOL/HDL Ratio: 4.6 Ratio

## 2011-02-19 LAB — TSH: TSH: 4.373 u[IU]/mL (ref 0.350–4.500)

## 2011-02-26 LAB — POTASSIUM: Potassium: 3.1 mEq/L — ABNORMAL LOW (ref 3.5–5.1)

## 2011-02-26 LAB — CBC
HCT: 39.3 % (ref 36.0–46.0)
HCT: 37 % (ref 36.0–46.0)
HCT: 38.4 % (ref 36.0–46.0)
Hemoglobin: 13.2 g/dL (ref 12.0–15.0)
Hemoglobin: 12.4 g/dL (ref 12.0–15.0)
Hemoglobin: 13 g/dL (ref 12.0–15.0)
MCHC: 33.7 g/dL (ref 30.0–36.0)
MCHC: 33.9 g/dL (ref 30.0–36.0)
MCHC: 34.1 g/dL (ref 30.0–36.0)
MCV: 92.4 fL (ref 78.0–100.0)
MCV: 92.8 fL (ref 78.0–100.0)
MCV: 94.2 fL (ref 78.0–100.0)
Platelets: 263 10*3/uL (ref 150–400)
Platelets: 291 10*3/uL (ref 150–400)
Platelets: 287 10*3/uL (ref 150–400)
RBC: 4.11 MIL/uL (ref 3.87–5.11)
RDW: 13.7 % (ref 11.5–15.5)
RDW: 13.7 % (ref 11.5–15.5)
WBC: 21.1 10*3/uL — ABNORMAL HIGH (ref 4.0–10.5)
WBC: 11.9 10*3/uL — ABNORMAL HIGH (ref 4.0–10.5)
WBC: 14.1 10*3/uL — ABNORMAL HIGH (ref 4.0–10.5)

## 2011-02-26 LAB — ANTI-SMOOTH MUSCLE ANTIBODY, IGG: F-Actin IgG: <20 U (ref <20)

## 2011-02-26 LAB — URINALYSIS, ROUTINE W REFLEX MICROSCOPIC
Hgb urine dipstick: NEGATIVE
Nitrite: NEGATIVE
Specific Gravity, Urine: 1.027 (ref 1.005–1.030)
Urobilinogen, UA: 1 mg/dL (ref 0.0–1.0)
pH: 6 (ref 5.0–8.0)

## 2011-02-26 LAB — COMPREHENSIVE METABOLIC PANEL
ALT: 94 U/L — ABNORMAL HIGH (ref 0–35)
ALT: 261 U/L — ABNORMAL HIGH (ref 0–35)
AST: 177 U/L — ABNORMAL HIGH (ref 0–37)
AST: 81 U/L — ABNORMAL HIGH (ref 0–37)
Albumin: 3.1 g/dL — ABNORMAL LOW (ref 3.5–5.2)
Albumin: 3.7 g/dL (ref 3.5–5.2)
Alkaline Phosphatase: 112 U/L (ref 39–117)
Alkaline Phosphatase: 121 U/L — ABNORMAL HIGH (ref 39–117)
Alkaline Phosphatase: 176 U/L — ABNORMAL HIGH (ref 39–117)
Alkaline Phosphatase: 197 U/L — ABNORMAL HIGH (ref 39–117)
BUN: 11 mg/dL (ref 6–23)
BUN: 19 mg/dL (ref 6–23)
BUN: 7 mg/dL (ref 6–23)
BUN: 13 mg/dL (ref 6–23)
BUN: 8 mg/dL (ref 6–23)
CO2: 34 mEq/L — ABNORMAL HIGH (ref 19–32)
Calcium: 8.2 mg/dL — ABNORMAL LOW (ref 8.4–10.5)
Calcium: 8.9 mg/dL (ref 8.4–10.5)
Chloride: 102 mEq/L (ref 96–112)
Chloride: 98 mEq/L (ref 96–112)
Chloride: 99 mEq/L (ref 96–112)
Creatinine, Ser: 0.98 mg/dL (ref 0.4–1.2)
Creatinine, Ser: 1.32 mg/dL — ABNORMAL HIGH (ref 0.4–1.2)
Creatinine, Ser: 0.85 mg/dL (ref 0.4–1.2)
GFR calc Af Amer: 51 mL/min — ABNORMAL LOW (ref >60)
GFR calc Af Amer: >60 mL/min (ref >60)
GFR calc non Af Amer: 60 mL/min — ABNORMAL LOW (ref >60)
Glucose, Bld: 135 mg/dL — ABNORMAL HIGH (ref 70–99)
Glucose, Bld: 141 mg/dL — ABNORMAL HIGH (ref 70–99)
Glucose, Bld: 127 mg/dL — ABNORMAL HIGH (ref 70–99)
Glucose, Bld: 145 mg/dL — ABNORMAL HIGH (ref 70–99)
Potassium: 2.8 mEq/L — ABNORMAL LOW (ref 3.5–5.1)
Potassium: 3 mEq/L — ABNORMAL LOW (ref 3.5–5.1)
Potassium: 3 mEq/L — ABNORMAL LOW (ref 3.5–5.1)
Potassium: 3.3 mEq/L — ABNORMAL LOW (ref 3.5–5.1)
Sodium: 136 mEq/L (ref 135–145)
Sodium: 137 mEq/L (ref 135–145)
Sodium: 136 mEq/L (ref 135–145)
Total Bilirubin: 0.9 mg/dL (ref 0.3–1.2)
Total Bilirubin: 1.1 mg/dL (ref 0.3–1.2)
Total Bilirubin: 1.1 mg/dL (ref 0.3–1.2)
Total Bilirubin: 0.7 mg/dL (ref 0.3–1.2)
Total Bilirubin: 1.2 mg/dL (ref 0.3–1.2)
Total Protein: 6.3 g/dL (ref 6.0–8.3)
Total Protein: 6.6 g/dL (ref 6.0–8.3)

## 2011-02-26 LAB — CARDIAC PANEL(CRET KIN+CKTOT+MB+TROPI)
CK, MB: 1.9 ng/mL (ref 0.3–4.0)
Relative Index: 1.2 (ref 0.0–2.5)
Total CK: 154 U/L (ref 7–177)
Troponin I: 0.02 ng/mL (ref 0.00–0.06)
Troponin I: 0.03 ng/mL (ref 0.00–0.06)

## 2011-02-26 LAB — PHOSPHORUS
Phosphorus: 2 mg/dL — ABNORMAL LOW (ref 2.3–4.6)
Phosphorus: 3.5 mg/dL (ref 2.3–4.6)

## 2011-02-26 LAB — DIFFERENTIAL
Eosinophils Relative: 0 % (ref 0–5)
Lymphocytes Relative: 10 % — ABNORMAL LOW (ref 12–46)
Lymphs Abs: 2.1 10*3/uL (ref 0.7–4.0)
Monocytes Absolute: 0.5 10*3/uL (ref 0.1–1.0)
Monocytes Relative: 3 % (ref 3–12)

## 2011-02-26 LAB — NA AND K (SODIUM & POTASSIUM), 24 H UR
Potassium Urine: 18 mEq/L
Sodium, Ur: 129 mEq/L
Urine Total Volume-UNAK24: 1300 mL

## 2011-02-26 LAB — HEPATITIS B SURFACE ANTIGEN: Hepatitis B Surface Ag: NEGATIVE

## 2011-02-26 LAB — URINE CULTURE

## 2011-02-26 LAB — CREATININE CLEARANCE, URINE, 24 HOUR
Collection Interval-CRCL: 24 hours
Creatinine Clearance: 64 mL/min — ABNORMAL LOW (ref 75–115)
Creatinine, 24H Ur: 900 mg/d (ref 700–1800)
Creatinine, Urine: 69.2 mg/dL
Creatinine: 0.98 mg/dL (ref 0.40–1.20)
Urine Total Volume-CRCL: 1300 mL

## 2011-02-26 LAB — CORTISOL: Cortisol, Plasma: 24.8 ug/dL

## 2011-02-26 LAB — POCT CARDIAC MARKERS
CKMB, poc: <1 ng/mL — ABNORMAL LOW (ref 1.0–8.0)
Myoglobin, poc: 254 ng/mL (ref 12–200)
Troponin i, poc: <0.05 ng/mL (ref 0.00–0.09)
Troponin i, poc: <0.05 ng/mL (ref 0.00–0.09)

## 2011-02-26 LAB — HEMOGLOBIN A1C
Hgb A1c MFr Bld: 6.6 % — ABNORMAL HIGH (ref 4.6–6.1)
Mean Plasma Glucose: 143 mg/dL

## 2011-02-26 LAB — URINE MICROSCOPIC-ADD ON

## 2011-02-26 LAB — LIPID PANEL
Cholesterol: 251 mg/dL — ABNORMAL HIGH (ref 0–200)
HDL: 28 mg/dL — ABNORMAL LOW (ref >39)
LDL Cholesterol: 166 mg/dL — ABNORMAL HIGH (ref 0–99)
Triglycerides: 284 mg/dL — ABNORMAL HIGH (ref <150)

## 2011-02-26 LAB — GLUCOSE, CAPILLARY
Glucose-Capillary: 132 mg/dL — ABNORMAL HIGH (ref 70–99)
Glucose-Capillary: 145 mg/dL — ABNORMAL HIGH (ref 70–99)

## 2011-02-26 LAB — PROTIME-INR
INR: 1 (ref 0.00–1.49)
Prothrombin Time: 13.9 seconds (ref 11.6–15.2)

## 2011-02-26 LAB — PROTEIN, URINE, 24 HOUR: Urine Total Volume-UPROT: 1300 mL

## 2011-02-26 LAB — APTT: aPTT: 25 seconds (ref 24–37)

## 2011-02-28 ENCOUNTER — Encounter: Payer: Self-pay | Admitting: Family Medicine

## 2011-03-12 ENCOUNTER — Encounter: Payer: Self-pay | Admitting: Family Medicine

## 2011-03-14 ENCOUNTER — Encounter: Payer: Self-pay | Admitting: Family Medicine

## 2011-03-14 ENCOUNTER — Ambulatory Visit (INDEPENDENT_AMBULATORY_CARE_PROVIDER_SITE_OTHER): Payer: BC Managed Care – PPO | Admitting: Family Medicine

## 2011-03-14 VITALS — BP 152/100 | HR 63 | Resp 16 | Ht 66.0 in | Wt 224.1 lb

## 2011-03-14 DIAGNOSIS — I10 Essential (primary) hypertension: Secondary | ICD-10-CM

## 2011-03-14 DIAGNOSIS — E785 Hyperlipidemia, unspecified: Secondary | ICD-10-CM

## 2011-03-14 DIAGNOSIS — R7302 Impaired glucose tolerance (oral): Secondary | ICD-10-CM | POA: Insufficient documentation

## 2011-03-14 DIAGNOSIS — E119 Type 2 diabetes mellitus without complications: Secondary | ICD-10-CM

## 2011-03-14 MED ORDER — PRAVASTATIN SODIUM 80 MG PO TABS: 80.0000 mg | ORAL_TABLET | Freq: Every evening | ORAL | Status: DC

## 2011-03-14 MED ORDER — OLMESARTAN MEDOXOMIL 20 MG PO TABS: 20.0000 mg | ORAL_TABLET | Freq: Every day | ORAL | Status: DC

## 2011-03-14 MED ORDER — METFORMIN HCL 500 MG PO TABS: 500.0000 mg | ORAL_TABLET | Freq: Two times a day (BID) | ORAL | Status: DC

## 2011-03-14 NOTE — Assessment & Plan Note (Signed)
Uncontrolled, dose inc

## 2011-03-14 NOTE — Assessment & Plan Note (Addendum)
Uncontrolled additional med to be started EKG shows normal sinus rhythm with no ischemia

## 2011-03-14 NOTE — Progress Notes (Signed)
  Subjective:    Patient ID: Jennifer [name], female    DOB: 02/17/1956, 55 y.o.   MRN: [id number]  HPI Pt reports acute back injury pulling river [place], in mid to right back still seeing the chiropracter with excellent results. Pt is here for f/o of chronic conditions and lab review. She now has a diagnosis of diabetes , which is new, she states the labs drawn represented fasting blood sugar, and this is markedly elevated, over 140, while her HBa1C is still in a prediabetic range. She denies polyuria, polydypsia, blurred vision or fatigue.She has a strong family h/o diabetes, and is not at all surprised by her dx. She is motivated even more than before to embrace lifestyle changes which will facilitate weight loss.     Review of Systems Denies recent fever or chills. Denies sinus pressure, nasal congestion, ear pain or sore throat. Denies chest congestion, productive cough or wheezing. Denies chest pains, palpitations, paroxysmal nocturnal dyspnea, orthopnea and leg swelling Denies abdominal pain, nausea, vomiting,diarrhea or constipation.  Denies rectal bleeding or change in bowel movement. Denies dysuria, frequency, hesitancy or incontinence.She is on hRT, and has been for less than 1 year. After discussing the association with increased risk of breast cancer , she has decided to discontinue . She was on this for management of hot flashes, also c/o hair loss and mood swings to a lesser extent.  Denies headaches, seizure, numbness, or tingling. Denies uncontrolled  depression, anxiety or insomnia. Denies skin break down or rash.        Objective:   Physical Exam Patient alert and oriented and in no Cardiopulmonary distress.  HEENT: No facial asymmetry, EOMI, no sinus tenderness, TM's clear, Oropharynx pink and moist.  Neck supple no adenopathy.  Chest: Clear to auscultation bilaterally.  CVS: S1, S2 no murmurs, no S3.  ABD: Soft non tender. Bowel sounds normal.  Ext: No  edema  MS: Adequate though reduced  ROM spine,normal in  shoulders, hips and knees.  Skin: Intact, no ulcerations or rash noted.  Psych: Good eye contact, normal affect. Memory intact not anxious or depressed appearing.  CNS: CN 2-12 intact, power, tone and sensation normal throughout.        Assessment & Plan:

## 2011-03-14 NOTE — Assessment & Plan Note (Signed)
New diagnosis, teaching in house and referral, start metformin

## 2011-03-14 NOTE — Patient Instructions (Addendum)
F/U in 5 weeks. You are starting a new med , You are diabetic, and will be referred to class. The  simvastatin is changed to pravastatin at a higher dose, your cholesterol is too high. Your blood pressure is too high , a new medication is being added which will also help to  protect your kidneys  It is important that you exercise regularly at least 30 minutes 5 times a week. If you develop chest pain, have severe difficulty breathing, or feel very tired, stop exercising immediately and seek medical attention  Goal for fasting blood sugar ranges from 80 to 120 and 2 hours after any meal or at bedtime should be between 130 to 170. Pleas test blood sugars once daily and record, bring to next visit. Your EKG is normal.  Pls stop the hormone therapy as discussed

## 2011-03-15 LAB — MICROALBUMIN / CREATININE URINE RATIO: Microalb Creat Ratio: 15.8 mg/g (ref 0.0–30.0)

## 2011-03-20 ENCOUNTER — Other Ambulatory Visit: Payer: Self-pay

## 2011-03-20 DIAGNOSIS — E119 Type 2 diabetes mellitus without complications: Secondary | ICD-10-CM

## 2011-03-20 MED ORDER — GLUCOSE BLOOD VI STRP: ORAL_STRIP | Status: DC

## 2011-03-20 MED ORDER — ONETOUCH DELICA LANCETS MISC: Status: DC

## 2011-03-21 ENCOUNTER — Telehealth: Payer: Self-pay | Admitting: Family Medicine

## 2011-03-21 NOTE — Telephone Encounter (Signed)
Was already sent in yesterday

## 2011-03-26 NOTE — Discharge Summary (Signed)
NAMEADISEN, BENNION              ACCOUNT NO.:  [id number]   MEDICAL RECORD NO.:  [id number]          PATIENT TYPE:  INP   LOCATION:  1420                         FACILITY:  [facility]   PHYSICIAN:  Herbie Saxon, MDDATE OF BIRTH:  1956-01-11   DATE OF ADMISSION:  12/23/2008  DATE OF DISCHARGE:                               DISCHARGE SUMMARY   DATE OF DISCHARGE:  To be determined.   DISCHARGE DIAGNOSES:  1. Syncopal episode.  2. Fall.  3. Fracture left ankle, status post open reduction internal fixation.  4. Elevated liver function tests.  5. Query autoimmune hepatitis.  6. Query toxic hepatitis.  7. Borderline new onset diabetes.  8. Hyperlipidemia.  9. Morbid obesity.  10.Prolonged Q-T interval.  11.Hypertension, stable.  12.History of depression.   CONSULTS:  Dr. Swazil[place], Cardiology.   RADIOLOGY:  The ankle x-ray of December 23, 2008, shows medial and  lateral malleolar fractures with subluxation at the tibiotalar junction.  Questionable small avulsion fracture of the lateral calcaneus.  Abdominal ultrasound of December 25, 2008, shows cholelithiasis, diffuse  fatty infiltration of the liver.  CT head December 23, 2008, shows no  acute findings.  CT cervical spine shows stable spondylosis of C5-C6 and  to a lesser degree at C6-C7.  Chest x-ray December 23, 2008, shows no  evidence of acute cardiopulmonary disease.  Lumbar spine x-ray of  December 23, 2008, shows questionable left renal calculus and no  evidence of acute abnormality.   HOSPITAL COURSE:  This is a 55 year old lady presented to the Emergency  Room with severe left ankle pain which was after having a fall secondary  to a  syncopal episode after taking a shower.  Loss of consciousness was  approximately about 2 minutes.  The patient was seen by orthopedic  physician, Dr. Sherlean Foot, who proceeded to do an ORIF to left ankle on  December 25, 2008, after hypokalemia has been supplemented.  The patient  came  with severe hypokalemia at 2.4, and she was started on parenteral  IV supplementation.  Also noticed to be having  prolonged Q-T interval  which is gradually improving with the potassium repletion.  The patient  was also noted to have severely elevated LFTs.  Hepatitis B and C  serology are negative.  She denies any history of hepatotoxins, and the  total bilirubin has been normal.  Abdominal ultrasound only shows fatty  liver and cholelithiasis.  Autoimmune workup and 24-hour urine profile  workup is also being done to evaluate further causes of  profound  hypokalemia.  The patient was also noted to be having borderline  elevated hyperglycemia.  Hemoglobin A1c is 6.3.  The borderline diabetes  will be addressed with diet and low dose sliding scale  insulin coverage  for now.  The patient may benefit from metformin treatment as outpatient  when liver function normalizes.  2-D echocardiogram on this admission  was done on December 26, 2008, showed ejection fraction was normal  ranging between 55-60%, and there was no left ventricular regional wall  motion abnormalities , however,  mild increase in the left ventricular  wall thickness.  The cardiologist believes that a syncopal episode was  probably due to her profound hypokalemia and signed off for now.   PHYSICAL EXAMINATION:  GENERAL:  She is a black lady obese in no acute  distress.  VITAL SIGNS:  Temperature 98, pulse 107, respiratory rate 20, blood  pressure 155/88.  HEENT:  Pupils equal and reactive to light and accommodation.  Conjunctival icterus.  HEART:  Sounds 1 and 2 regular.  NECK:  Supple.  No lymphadenopathy.  CHEST:  Clear clinically.  ABDOMEN:  She has truncal obesity.  No tenderness.  Bowel sounds  present.  No hernia.  She is alert and oriented x3.  Left ankle is immobilized.  Peripheral pulses present.  No edema.   LABS:  WBCs 14, hematocrit 38, platelet count 274,000, ESR 49, cortisol  is normal, magnesium  level  normal, glucose 145, hemoglobin A1c 6.6,  potassium 3.3, alkaline phosphatase 197, AST 147, ALT 261, total  bilirubin 1.2.   PLAN:  Follow the 24-hour urine profile.  Consider a Pathology and  Nephrology evaluation.  Continue to optimize potassium level by oral  supplementation.  Also note that carotid Doppler on date of admission  was negative.  Discharge medications will be dictated on final  discharge.  The patient will also benefit from short-term rehab  placement if the patient is agreeable to this.      Herbie Saxon, MD  Electronically Signed     MIO/MEDQ  D:  12/27/2008  T:  12/27/2008  Job:  [id number]

## 2011-03-26 NOTE — Discharge Summary (Signed)
NAMECANDID, BOVEY              ACCOUNT NO.:  [id number]   MEDICAL RECORD NO.:  [id number]          PATIENT TYPE:  INP   LOCATION:  1420                         FACILITY:  [facility]   PHYSICIAN:  Beckey Rutter, MD  DATE OF BIRTH:  04/09/1956   DATE OF ADMISSION:  12/23/2008  DATE OF DISCHARGE:  12/28/2008                               DISCHARGE SUMMARY   ADDENDUM:  Please amend this discharge summary to the previously dictated discharge  summary by Dr. Christella Noa.   DISCHARGE MEDICATIONS:  1. K-Dur 40 mEq p.o. daily for 7 days.  2. Percocet 5/325 mg p.o. every 8 hours p.r.n.  3. Atenolol 50 mg p.o. daily.   The patient is stable for discharge today.  Her potassium is 3.0 but the  patient does not have any symptoms.  I suspect the patient's low  potassium is secondary to the profound hypokalemia the patient  experienced on the long-term hydrochlorothiazide she was taking before.  The patient's QTC has improved.  I advised the patient to follow up with  her primary physician within 1 week to reassess the renal function test  including the potassium, as well as her EKG.  She is aware and agreeable  to discharge plan and looking forward to it.      Beckey Rutter, MD  Electronically Signed     EME/MEDQ  D:  12/28/2008  T:  12/28/2008  Job:  [id number]

## 2011-03-26 NOTE — Op Note (Signed)
[name], [name]              ACCOUNT NO.:  [id number]   MEDICAL RECORD NO.:  [id number]          PATIENT TYPE:  INP   LOCATION:  1420                         FACILITY:  [facility]   PHYSICIAN:  Mila Homer. Sherlean Foot, M.D. DATE OF BIRTH:  [birth year]   DATE OF PROCEDURE:  12/23/2008  DATE OF DISCHARGE:                               OPERATIVE REPORT   SURGEON:  Mila Homer. Sherlean Foot, MD   ASSISTANT:  None.   ANESTHESIA:  General.   PREOPERATIVE DIAGNOSIS:  Left ankle fracture with syndesmotic injury.   POSTOPERATIVE DIAGNOSIS:  Left ankle fracture with syndesmotic injury.   PROCEDURE:  Left ankle open reduction and internal fixation with  syndesmotic repair.   INDICATIONS FOR PROCEDURE:  The patient is a 55 year old who had a  syncopal episode, twisted the ankle, fractured it on Friday night.  She  was brought in, potassium was given to the patient, and she was cleared  for surgery today.   DESCRIPTION OF PROCEDURE:  The patient was taken to the operating room,  administered general anesthesia with left ankle prepped and draped in  usual sterile fashion.  A direct lateral approach to the ankle was  performed with first a 10 blade for incision and #15 blade to sharply  dissect down to the bone coming off the lateral cortex.  I then  irrigated and used the dental pick to clean out the fracture.  I then  used bone reduction forceps to reduce the fracture and then placed a 20-  mm lag screw.  This held the fracture stable.  I did fasten the 7-hole  semitubular plate to the lateral cortex, placed 2 cancellous screws  distally, 3 bicortical screws proximally, took x-rays to ensure anatomic  reduction of the mortise.  I then used the towel clip and tested under  fluoro, and the syndesmosis was unstable.  I then placed a tight wire  through one of the drill holes about 2.5 cm proximal to the joint and  centered that down with the leg in 90 degrees of dorsiflexion.  I then  irrigated and closed  with 0 and 2-0 Vicryl sutures, dressed with  Xeroform dressing and Steri-Strips, then placed a stirrup splint after  dressing with Xeroform dressing, sponges, and sterile Webril.   TOURNIQUET TIME:  47 minutes.   COMPLICATIONS:  None.   DRAINS:  None.          ______________________________  Mila Homer. Sherlean Foot, M.D.    SDL/MEDQ  D:  12/25/2008  T:  12/25/2008  Job:  [id number]

## 2011-03-26 NOTE — Consult Note (Signed)
Jennifer [name], Jennifer [name]              ACCOUNT NO.:  [id number]   MEDICAL RECORD NO.:  [id number]          PATIENT TYPE:  INP   LOCATION:  1420                         FACILITY:  [facility]   PHYSICIAN:  Peter M. Swaziland, M.D.  DATE OF BIRTH:  1956/07/14   DATE OF CONSULTATION:  12/24/2008  DATE OF DISCHARGE:                                 CONSULTATION   HISTORY OF PRESENT ILLNESS:  Ms. Jennifer [name] is a 55 year old white female  with a history of hypertension.  She was admitted yesterday following a  syncopal episode with subsequent fracture of her left ankle.  She states  she had a recent bad sinus infection.  She was taking a shower yesterday  when she states suddenly her arms felt like they were getting very heavy  and she then passed out.  Afterwards she felt lightheaded but was able  to summon help.  She was found to have a complex fracture of her left  ankle.  She has been on atenolol and hydrochlorothiazide chronically for  hypertension.  She was apparently told 15 years ago that she had atrial  fibrillation but subsequent doctors told her that she did not.  She had  a stress test at that time which was apparently unremarkable.  She has  no known history of coronary artery disease, congestive heart failure or  murmur.  She has had no prior history of syncope.  It was noted on  admission that she was severely hypokalemic with a potassium of 2.4.   PAST MEDICAL HISTORY:  Significant for hypertension and depression.   PRIOR MEDICATIONS:  1. Wellbutrin 150 mg b.i.d.  2. Atenolol/HCTZ 50/25 mg per day.   She has no known allergies.   SOCIAL HISTORY:  She is single, has no children.  She has no prior  history of smoke or alcohol use.   FAMILY HISTORY:  Negative for coronary disease or early coronary or  cardiac death.   REVIEW OF SYSTEMS:  She has had no recent edema, orthopnea, PND.  She  has had no symptoms of dizziness, nausea or change in bowel or bladder  habits.  She denies any  chest pain.  All other systems were reviewed and  are negative.   PHYSICAL EXAMINATION:  The patient is a pleasant white female in no  apparent distress.  Blood pressure 130/78, pulse 70 and regular.  She  has no arrhythmia on monitor, saturations are 95% on room air.  She is  afebrile.  HEENT:  She is normocephalic, atraumatic.  Her pupils equal, round and  reactive.  Sclerae clear.  Oropharynx is clear.  NECK:  Supple.  Without JVD, adenopathy, thyromegaly or bruits.  LUNGS:  Clear to auscultation and percussion.  CARDIAC:  Reveals a irregular rate and rhythm without murmur, rub,  gallop or click.  ABDOMEN:  Soft, nontender without mass or bruits.  Bowel sounds are  positive.  Her left ankle is splinted.  She has no lower extremity  edema.  Pedal pulses are good on the right [name].  NEUROLOGIC:  She is alert and oriented x4.  Cranial nerves II-XII are  intact.  There are no gross motor deficits.   LABORATORY DATA:  Chest x-ray showed no active disease.  Cranial CT was  negative.  C-spine showed spondylosis.  White count 13,900, hemoglobin  13.2, hematocrit 39.3, platelets 279,000.  Sodium was 138, potassium was  2.8 today, chloride 100, CO2 29.  BUN 19, creatinine 1.24, glucose of  135.  Coags were normal.  AST today was elevated at 150, ALT 146,  alkaline phosphatase 121, albumin is 3.2.  Cardiac enzymes were negative  x3.  Phosphorus was 2.0, magnesium 2.8, calcium 8.5.  ECG shows normal  sinus rhythm with nonspecific ST-T wave changes.  There is prolonged QTc  of 519 milliseconds.   IMPRESSION:  1. Syncope.  2. Severe hypokalemia.  3. Prolonged QT secondary to number 2.  4. Hypertension.  5. Question remote history of arrhythmia.  No arrhythmia noted since      hospitalization.  6. Ankle fracture.   PLAN:  I agree with prophylactic Lovenox.  Would hold her atenolol and  HCTZ for now and aggressively replete her potassium.  I think once her  potassium is repleted to more  than 3.6 she would be a low-risk to  proceed with her ankle surgery.  We will follow up on her echocardiogram  on Monday and I would recommend that she stay off of HCTZ indefinitely  and use other medications, if needed, for blood pressure control.           ______________________________  Peter M. Sw[place]iland, M.D.     PMJ/MEDQ  D:  12/24/2008  T:  12/25/2008  Job:  [id number]   cc:   Jennifer Saxon, MD   Jennifer [name], M.D.  Fax: [phone]   Jennifer [name], M.D.  Fax: [phone]

## 2011-03-26 NOTE — H&P (Signed)
NAMESANIKA, BROSIOUS              ACCOUNT NO.:  [id number]   MEDICAL RECORD NO.:  [id number]          PATIENT TYPE:  INP   LOCATION:  1420                         FACILITY:  [facility]   PHYSICIAN:  Raphael Gibney, MD        DATE OF BIRTH:  [birth year]   DATE OF ADMISSION:  12/23/2008  DATE OF DISCHARGE:                              HISTORY & PHYSICAL   CHIEF COMPLAINT:  Syncope and left ankle fracture.   HISTORY OF PRESENT ILLNESS:  Ms. Jennifer [name] is a 55 year old Caucasian  female who states that she was in her usual state of health until last  evening when she went to have a shower.  She mentions that while she was  taking a shower she blacked out.  She notes that her loss of  consciousness may have been for up to about two minutes.  She denies any  preceding aura or light-headedness or dizziness.  She denies any chest  pain, shortness of breath, nausea, vomiting preceding the fall result.  She noted that after she gained consciousness that her ankle was in an  odd position.  She could not move it as well.  She denies any  postsyncopal headache, micturition, loss of continence.  She also denied  any seizure-like activity.  She felt that she may have broken her ankle.  She was brought by the EMS with full spinal immobilization and splinting  to the [facility] Emergency Department.   PAST MEDICAL HISTORY:  1. History of hypertension.  2. History of depression.  3. Questionable history of atrial fibrillation.  4. Prior history of possible syncope in late fall.   ALLERGIES:  No known drug allergies.   MEDICATIONS:  1. Atenolol/hydrochlorothiazide 50/25 p.o. daily.  2. Wellbutrin 150 mg p.o. b.i.d.   SOCIAL HISTORY:  Denies any significant history of smoking, alcohol, or  illicit drug use.   FAMILY HISTORY:  Denies any significant history of coronary artery  disease, syncope, seizures, or any other problems.   REVIEW OF SYSTEMS:  A detailed review of systems was performed and was  found to be negative except as mentioned in the HPI.   PHYSICAL EXAMINATION:  VITAL SIGNS:  Pulse 70, blood pressure 128/78,  respirations 12, saturation 96% on room air, afebrile.  GENERAL:  In general Ms. Jennifer [name] is a 55 year old female in no acute  distress.  Her left ankle is splinted.  HEENT:  Pupils are equal and reactive to light.  Extraocular movements  are intact.  Oropharynx clear without any lesions.  LUNGS:  Clear bilaterally.  CARDIOVASCULAR:  Normal S1/S2.  No murmurs, rubs, or gallops.  ABDOMEN:  Soft, nontender.  EXTREMITIES:  Left ankle in a splint.  No cyanosis, clubbing, or edema  noted on the right foot.  NEUROLOGIC:  No obvious focal deficits.   LABORATORY DATA:  WBC 21.1, hemoglobin 15.3, platelets at 291.  PT 14.6,  INR 1.1, PTT 25.  Sodium 137, potassium 2.4, chloride 98, CO2 26,  glucose 178, BUN 18, creatinine 1.3.  AST 81, ALT 54, magnesium 2.8.  UA:  Turbid appearance with many squamous epithelium,  small amount of  leukocytes, and many bacteria noted.   Radiologic data:  CT of the head did not show any acute intracranial  process.  X-ray of the lumbar spine:  No evidence of acute abnormality.  Chest x-ray:  No evidence of acute cardiopulmonary disease.  CT of the C-  spine:  Stable appearance of cervical spine with spondylosis at several  levels but without any sign of acute fracture or subluxation.  Left  ankle picture showed a post reduction of medial and lateral malleolar  fractures and fibulotalar subluxation with dimpled alignment with  relocation as described.   ASSESSMENT:  Ms. Jennifer [name] is a 55 year old Caucasian female who presents  to the emergency department with an episode of syncope and left ankle  fracture and urinary tract infection and sinus drainage.   PLAN:  Will admit to the inpatient service under [facility] team H.  Orthopedics has seen her in the emergency department and plans to do  definitive surgery once she is medically clear.   We will replace  potassium aggressively for her hypokalemia.  For her prolonged QTC of  about 490 msec, this could be related to her low potassium.  We will  replace potassium and repeat EKG in a.m.  We would also like to obtain a  cardiology input before surgical clearance.  Will start IV Rocephin for  her UTI.  Will follow her mild transaminitis which does not have any  obvious etiology at this time.  May need hepatitis workup if follow-up  CMP shows worsening enzymes.  GI prophylaxis is PPI, DVT prophylaxis  with Lovenox.  Code status is full.      Raphael Gibney, MD  Electronically Signed     HV/MEDQ  D:  12/24/2008  T:  12/24/2008  Job:  [id number]

## 2011-04-03 ENCOUNTER — Telehealth: Payer: Self-pay | Admitting: Family Medicine

## 2011-04-03 NOTE — Telephone Encounter (Signed)
Advised ER for eval

## 2011-04-12 NOTE — Progress Notes (Signed)
Came in 5.3.12

## 2011-04-17 ENCOUNTER — Encounter: Payer: Self-pay | Admitting: Family Medicine

## 2011-04-18 ENCOUNTER — Ambulatory Visit (INDEPENDENT_AMBULATORY_CARE_PROVIDER_SITE_OTHER): Payer: BC Managed Care – PPO | Admitting: Family Medicine

## 2011-04-18 ENCOUNTER — Encounter: Payer: Self-pay | Admitting: Family Medicine

## 2011-04-18 VITALS — BP 168/108 | HR 87 | Resp 16 | Ht 66.0 in | Wt 221.4 lb

## 2011-04-18 DIAGNOSIS — G4700 Insomnia, unspecified: Secondary | ICD-10-CM

## 2011-04-18 DIAGNOSIS — F329 Major depressive disorder, single episode, unspecified: Secondary | ICD-10-CM

## 2011-04-18 DIAGNOSIS — Z23 Encounter for immunization: Secondary | ICD-10-CM

## 2011-04-18 DIAGNOSIS — E119 Type 2 diabetes mellitus without complications: Secondary | ICD-10-CM

## 2011-04-18 DIAGNOSIS — E785 Hyperlipidemia, unspecified: Secondary | ICD-10-CM

## 2011-04-18 DIAGNOSIS — I10 Essential (primary) hypertension: Secondary | ICD-10-CM

## 2011-04-18 DIAGNOSIS — E669 Obesity, unspecified: Secondary | ICD-10-CM

## 2011-04-18 DIAGNOSIS — F3289 Other specified depressive episodes: Secondary | ICD-10-CM

## 2011-04-18 MED ORDER — TEMAZEPAM 15 MG PO CAPS: 15.0000 mg | ORAL_CAPSULE | Freq: Every evening | ORAL | Status: DC | PRN

## 2011-04-18 MED ORDER — BUPROPION HCL ER (XL) 150 MG PO TB24: 150.0000 mg | ORAL_TABLET | Freq: Every day | ORAL | Status: DC

## 2011-04-18 MED ORDER — OLMESARTAN MEDOXOMIL 40 MG PO TABS: 40.0000 mg | ORAL_TABLET | Freq: Every day | ORAL | Status: DC

## 2011-04-18 NOTE — Patient Instructions (Signed)
F/u mid July.  New dose of benicar is 40 mg one daily start tomorrow.  Pls stop xanax, new med for insomnia start tonight. Practicve gopod sleep hygiene also.  TDAP and pneumovac today.  Fasting lipid, cmp and HBA1C mid July  pls go to class for diabetes.  It is important that you exercise regularly at least 30 minutes 5 times a week. If you develop chest pain, have severe difficulty breathing, or feel very tired, stop exercising immediately and seek medical attention   A healthy diet is rich in fruit, vegetables and whole grains. Poultry fish, nuts and beans are a healthy choice for protein rather then red meat. A low sodium diet and drinking 64 ounces of water daily is generally recommended. Oils and sweet should be limited. Carbohydrates especially for those who are diabetic or overweight, should be limited to 34-45 gram per meal. It is important to eat on a regular schedule, at least 3 times daily. Snacks should be primarily fruits, vegetables or nuts.
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
## 2011-04-27 NOTE — Assessment & Plan Note (Signed)
Improved. Pt applauded on succesful weight loss through lifestyle change, and encouraged to continue same. Weight loss goal set for the next several months.  
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
## 2011-05-09 ENCOUNTER — Ambulatory Visit (HOSPITAL_COMMUNITY)
Admission: RE | Admit: 2011-05-09 | Payer: BC Managed Care – PPO | Source: Ambulatory Visit | Admitting: Internal Medicine

## 2011-05-09 ENCOUNTER — Encounter (INDEPENDENT_AMBULATORY_CARE_PROVIDER_SITE_OTHER): Payer: Self-pay | Admitting: Internal Medicine

## 2011-06-26 ENCOUNTER — Encounter: Payer: Self-pay | Admitting: Family Medicine

## 2011-06-26 ENCOUNTER — Ambulatory Visit (INDEPENDENT_AMBULATORY_CARE_PROVIDER_SITE_OTHER): Payer: BC Managed Care – PPO | Admitting: Family Medicine

## 2011-06-26 VITALS — BP 160/94 | HR 77 | Ht 66.0 in | Wt 221.0 lb

## 2011-06-26 DIAGNOSIS — E785 Hyperlipidemia, unspecified: Secondary | ICD-10-CM

## 2011-06-26 DIAGNOSIS — R4589 Other symptoms and signs involving emotional state: Secondary | ICD-10-CM

## 2011-06-26 DIAGNOSIS — E119 Type 2 diabetes mellitus without complications: Secondary | ICD-10-CM

## 2011-06-26 DIAGNOSIS — F603 Borderline personality disorder: Secondary | ICD-10-CM

## 2011-06-26 DIAGNOSIS — F329 Major depressive disorder, single episode, unspecified: Secondary | ICD-10-CM

## 2011-06-26 DIAGNOSIS — M79609 Pain in unspecified limb: Secondary | ICD-10-CM

## 2011-06-26 DIAGNOSIS — R5383 Other fatigue: Secondary | ICD-10-CM

## 2011-06-26 DIAGNOSIS — F3289 Other specified depressive episodes: Secondary | ICD-10-CM

## 2011-06-26 DIAGNOSIS — G4700 Insomnia, unspecified: Secondary | ICD-10-CM

## 2011-06-26 DIAGNOSIS — I10 Essential (primary) hypertension: Secondary | ICD-10-CM

## 2011-06-26 DIAGNOSIS — R5381 Other malaise: Secondary | ICD-10-CM

## 2011-06-26 DIAGNOSIS — M79671 Pain in right foot: Secondary | ICD-10-CM

## 2011-06-26 DIAGNOSIS — R7301 Impaired fasting glucose: Secondary | ICD-10-CM

## 2011-06-26 MED ORDER — TEMAZEPAM 30 MG PO CAPS: 30.0000 mg | ORAL_CAPSULE | Freq: Every evening | ORAL | Status: DC | PRN

## 2011-06-26 MED ORDER — ATENOLOL-CHLORTHALIDONE 50-25 MG PO TABS: 1.0000 | ORAL_TABLET | Freq: Every day | ORAL | Status: DC

## 2011-06-26 MED ORDER — FLUOXETINE HCL 10 MG PO CAPS: 10.0000 mg | ORAL_CAPSULE | Freq: Every day | ORAL | Status: DC

## 2011-06-26 NOTE — Patient Instructions (Addendum)
F/U in 6 weeks Pls increase the restoril to 15mg  TWO at night, the new script is 30mg  One at night  New med for mood  Is fluoxetine, continue wellbutrin.  Lipid, hepatic , chem 7 HBA1c  B12 level just before next visit (3 to 5 days)  Fasting  Additional med for your blood pressure. Podiatry eval for right foot pain
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
## 2011-06-30 NOTE — Assessment & Plan Note (Signed)
Controlled, no change in medication  

## 2011-06-30 NOTE — Assessment & Plan Note (Signed)
Deteriorating x 1 year, will refer to podiatry

## 2011-06-30 NOTE — Assessment & Plan Note (Signed)
Updated lab prior to next visit, managing with diet only

## 2011-06-30 NOTE — Assessment & Plan Note (Signed)
Deteriorated off HRT, will attempt another SSRI, lexapro reportedly had negative effect on sexual desire

## 2011-06-30 NOTE — Assessment & Plan Note (Signed)
Improved, but still not controlled, increase med dose

## 2011-06-30 NOTE — Assessment & Plan Note (Signed)
Uncontrolled, needs updated labs, pt to continue meds

## 2011-06-30 NOTE — Assessment & Plan Note (Signed)
Increased and uncontrolled add prozac to wellbutrin

## 2011-07-16 ENCOUNTER — Other Ambulatory Visit: Payer: Self-pay | Admitting: Family Medicine

## 2011-08-07 ENCOUNTER — Telehealth: Payer: Self-pay | Admitting: Family Medicine

## 2011-08-08 MED ORDER — TEMAZEPAM 15 MG PO CAPS: ORAL_CAPSULE | ORAL | Status: DC

## 2011-08-08 NOTE — Telephone Encounter (Signed)
Has to do bloodwork before her surgery on Nov 1 and wanted to extend her appt out until the end of Nov

## 2011-08-21 ENCOUNTER — Other Ambulatory Visit: Payer: Self-pay | Admitting: Family Medicine

## 2011-09-05 ENCOUNTER — Encounter (HOSPITAL_COMMUNITY): Payer: Self-pay

## 2011-09-05 ENCOUNTER — Encounter (HOSPITAL_COMMUNITY)
Admission: RE | Admit: 2011-09-05 | Discharge: 2011-09-05 | Disposition: A | Discharge status: Home / Self Care | Payer: BC Managed Care – PPO | Source: Ambulatory Visit | Attending: Podiatry | Admitting: Podiatry

## 2011-09-05 LAB — BASIC METABOLIC PANEL
BUN: 28 mg/dL — ABNORMAL HIGH (ref 6–23)
Chloride: 102 mEq/L (ref 96–112)
Creatinine, Ser: 1.24 mg/dL — ABNORMAL HIGH (ref 0.50–1.10)
Glucose, Bld: 106 mg/dL — ABNORMAL HIGH (ref 70–99)
Potassium: 3.3 mEq/L — ABNORMAL LOW (ref 3.5–5.1)

## 2011-09-05 LAB — DIFFERENTIAL
Eosinophils Absolute: 0.2 10*3/uL (ref 0.0–0.7)
Lymphs Abs: 3.2 10*3/uL (ref 0.7–4.0)
Monocytes Absolute: 0.7 10*3/uL (ref 0.1–1.0)
Monocytes Relative: 8 % (ref 3–12)
Neutro Abs: 4.3 10*3/uL (ref 1.7–7.7)
Neutrophils Relative %: 51 % (ref 43–77)

## 2011-09-05 LAB — CBC
HCT: 39.9 % (ref 36.0–46.0)
Hemoglobin: 12.9 g/dL (ref 12.0–15.0)
MCH: 30.1 pg (ref 26.0–34.0)
RBC: 4.28 MIL/uL (ref 3.87–5.11)

## 2011-09-05 NOTE — Patient Instructions (Addendum)
21 Jennifer [name]  09/05/2011   Your procedure is scheduled on:  09/12/11  Report to [facility] [place] AM.  Call this number if you have problems the morning of surgery: [phone]   Remember:   Do not eat food:After Midnight.  Do not drink clear liquids: After Midnight.  Take these medicines the morning of surgery with A SIP OF WATER: bupropion, feldene, atenolol-chlorthalidone, benicar   Do not wear jewelry, make-up or nail polish.  Do not wear lotions, powders, or perfumes. You may wear deodorant.  Do not shave 48 hours prior to surgery.  Do not bring valuables to the hospital.  Contacts, dentures or bridgework may not be worn into surgery.  Leave suitcase in the car. After surgery it may be brought to your room.  For patients admitted to the hospital, checkout time is 11:00 AM the day of discharge.   Patients discharged the day of surgery will not be allowed to drive home.  Name and phone number of your driver: driver  Special Instructions: CHG Shower Use Special Wash: 1/2 bottle night before surgery and 1/2 bottle morning of surgery.   Please read over the following fact sheets that you were given: Pain Booklet, MRSA Information, Surgical Site Infection Prevention, Anesthesia Post-op Instructions and Care and Recovery After Surgery  PATIENT INSTRUCTIONS POST-ANESTHESIA  IMMEDIATELY FOLLOWING SURGERY:  Do not drive or operate machinery for the first twenty four hours after surgery.  Do not make any important decisions for twenty four hours after surgery or while taking narcotic pain medications or sedatives.  If you develop intractable nausea and vomiting or a severe headache please notify your doctor immediately.  FOLLOW-UP:  Please make an appointment with your surgeon as instructed. You do not need to follow up with anesthesia unless specifically instructed to do so.  WOUND CARE INSTRUCTIONS (if applicable):  Keep a dry clean dressing on the anesthesia/puncture wound site if  there is drainage.  Once the wound has quit draining you may leave it open to air.  Generally you should leave the bandage intact for twenty four hours unless there is drainage.  If the epidural site drains for more than 36-48 hours please call the anesthesia department.  QUESTIONS?:  Please feel free to call your physician or the hospital operator if you have any questions, and they will be happy to assist you.     [facility] Anesthesia Department [address] [place] [phone]

## 2011-09-06 ENCOUNTER — Encounter: Payer: Self-pay | Admitting: Family Medicine

## 2011-09-10 ENCOUNTER — Ambulatory Visit (INDEPENDENT_AMBULATORY_CARE_PROVIDER_SITE_OTHER): Payer: BC Managed Care – PPO | Admitting: Family Medicine

## 2011-09-10 ENCOUNTER — Encounter: Payer: Self-pay | Admitting: Family Medicine

## 2011-09-10 VITALS — BP 114/80 | HR 55 | Resp 16 | Ht 66.0 in | Wt 217.0 lb

## 2011-09-10 DIAGNOSIS — E785 Hyperlipidemia, unspecified: Secondary | ICD-10-CM

## 2011-09-10 DIAGNOSIS — R4589 Other symptoms and signs involving emotional state: Secondary | ICD-10-CM

## 2011-09-10 DIAGNOSIS — F603 Borderline personality disorder: Secondary | ICD-10-CM

## 2011-09-10 DIAGNOSIS — Z23 Encounter for immunization: Secondary | ICD-10-CM

## 2011-09-10 DIAGNOSIS — I10 Essential (primary) hypertension: Secondary | ICD-10-CM

## 2011-09-10 DIAGNOSIS — E669 Obesity, unspecified: Secondary | ICD-10-CM

## 2011-09-10 DIAGNOSIS — R5383 Other fatigue: Secondary | ICD-10-CM

## 2011-09-10 DIAGNOSIS — E875 Hyperkalemia: Secondary | ICD-10-CM

## 2011-09-10 DIAGNOSIS — E119 Type 2 diabetes mellitus without complications: Secondary | ICD-10-CM

## 2011-09-10 DIAGNOSIS — R5381 Other malaise: Secondary | ICD-10-CM

## 2011-09-10 MED ORDER — FLUOXETINE HCL 20 MG PO CAPS: 20.0000 mg | ORAL_CAPSULE | Freq: Every day | ORAL | Status: DC

## 2011-09-10 NOTE — Patient Instructions (Addendum)
F/U in 4. months.  Flu vaccine today.  All the best with your surgery.    It is important that you exercise regularly at least 30 minutes 5 times a week. If you develop chest pain, have severe difficulty breathing, or feel very tired, stop exercising immediately and seek medical attention   A healthy diet is rich in fruit, vegetables and whole grains. Poultry fish, nuts and beans are a healthy choice for protein rather then red meat. A low sodium diet and drinking 64 ounces of water daily is generally recommended. Oils and sweet should be limited. Carbohydrates especially for those who are diabetic or overweight, should be limited to 34-40 gram per meal. It is important to eat on a regular schedule, at least 3 times daily. Snacks should be primarily fruits, vegetables or nuts.   Weight loss goal of 2 pounds per month  Hba1c and chem 7 in 4 month   Pls make appt with gynae for pap and mammogram.  Dose increase on the prozac to 20mg  daily, pls use all of your psychology training to continue to help yourself   Fasting chem 7, lipid, heptic,  And HBA1C  In the morning, we will call you about the potassium  Take TWO extra potassium tablets, 99mg  OTC today, and take one daily moving forward All the best with your surgery

## 2011-09-11 ENCOUNTER — Telehealth: Payer: Self-pay | Admitting: Family Medicine

## 2011-09-11 ENCOUNTER — Ambulatory Visit (HOSPITAL_COMMUNITY)
Admission: RE | Admit: 2011-09-11 | Discharge: 2011-09-11 | Disposition: A | Discharge status: Home / Self Care | Payer: BC Managed Care – PPO | Source: Ambulatory Visit | Attending: Family Medicine | Admitting: Family Medicine

## 2011-09-11 DIAGNOSIS — I10 Essential (primary) hypertension: Secondary | ICD-10-CM | POA: Insufficient documentation

## 2011-09-11 DIAGNOSIS — Z01818 Encounter for other preprocedural examination: Secondary | ICD-10-CM | POA: Insufficient documentation

## 2011-09-11 LAB — BASIC METABOLIC PANEL
BUN: 23 mg/dL (ref 6–23)
CO2: 30 mEq/L (ref 19–32)
Chloride: 100 mEq/L (ref 96–112)
Creat: 1.24 mg/dL — ABNORMAL HIGH (ref 0.50–1.10)

## 2011-09-11 NOTE — H&P (Signed)
Chief Complaint / History of Present Illness: This 55 year old female returns today for a consultation to discusses the proposed surgery.  She is scheduled for retrocalcaneal exostectomy of the right lower extremity on 09/12/2011 at [facility].  Past Medical History:  Musculoskeletal Hx: (+) fracture history left ankle.   Psychiatric Hx: (+) depression.   Cardiovascular Hx: (+) high cholesterol, hypertension.   Endocrine Hx: (+) diabetes controlled by diet.   Medication History: Active: bupropion (active), Feldene 20 mg capsule (Take 1 capsule by mouth once a day with meals.) (active); usage started on 07/09/2011 medication was prescribed by Theola Sequin DPM, atenolol-chlorthalidone (active), benicar tablets (active).   Allergies: No known medical allergies Past Surgical History: Patient/Guardian admits past surgical history of cholecystectomy, ankle surgery.    Social History: Patient/Guardian denies alcohol use, Patient/Guardian denies tobacco use, Patient/Guardian denies illegal drug use.   Family History: Patient/Guardian admits a family history of diabetes associated with aunt, sibling, uncle, hypertension associated with aunt, sibling, uncle.   Review of Systems: No abnormal findings with the exception of the chief complaint.    Physical Exam: The patient is a pleasant, 55 year old female in no apparent distress.  She is oriented to person, place and time.  Skin is warm, dry and supple.  No hyperkeratotic lesions are present.  No ulceration are present.  Dorsalis pedis and posterior tibial pulses are palpable.  Muscle strength is 5/5 with regards to dorsiflexion, plantarflexion, inversion and eversion.   There is marked tenderness to palpation along posterior aspect of the right heel.  There is no ecchymosis noted.  There is a mild edema noted.  There is no indication of trauma to the area.  The posterior heel is somewhat hypertropic, when compared to the contralateral heel.  Light touch  sensation is grossly intact.    Test Results:  None to report at this time.    Impression:  Retrocalcaneal exostosis with associated achilles tendinitis.    Plan:   The podiatric pathology and treatment options were again reviewed with the her.  I discussed with the her the surgical procedure itself, the indications, the risks, possible complications, post-operative course and alternative treatments. I gave no guarantees regarding the outcome. She would like to proceed with the proposed surgery.  An informed consent was obtained.  She is to return for her post-operative appointment or sooner if problems arise.  She was advised to discontinue taking aspirin and all anti-inflammatory medications 7 days prior to the procedure.  Prescriptions: Rx: lovenox for subcutaneous injection- 40 mg/0.4 ml injection, solution, Inject 1 injection, solution subcutaneously once a day starting on 09/13/2011.  Dispense: 14 injection, solution. Refills: 0.  Allow Generic: Yes Rx: Phenergan- 12.5 mg tablet , Take 1 tablet by mouth every four to six hours as needed. Max daily dose: 6.  Dispense: 21 tablet. Refills: 1.  Allow Generic: Yes Rx: Percocet- 325 mg-10mg  tablet , Take 1 tablet by mouth every six hours as needed for pain. Max daily dose: 4.  Dispense: 42 tablet. Refills: 0.  Allow Generic: Yes

## 2011-09-11 NOTE — Anesthesia Preprocedure Evaluation (Addendum)
Anesthesia Evaluation  Patient identified by MRN, date of birth, ID band Patient awake  General Assessment Comment  Reviewed: Allergy & Precautions, H&P , NPO status , Patient's Chart, lab work & pertinent test results, reviewed documented beta blocker date and time   History of Anesthesia Complications Negative for: history of anesthetic complications  Airway Mallampati: II      Dental  (+) Teeth Intact   Pulmonary  clear to auscultation        Cardiovascular hypertension, Pt. on medications Regular Normal    Neuro/Psych PSYCHIATRIC DISORDERS Depression    GI/Hepatic   Endo/Other  Diabetes mellitus- (diet only), Well Controlled, Type 2  Renal/GU      Musculoskeletal   Abdominal   Peds  Hematology   Anesthesia Other Findings   Reproductive/Obstetrics                           Anesthesia Physical Anesthesia Plan  ASA: III  Anesthesia Plan: MAC   Post-op Pain Management:    Induction:   Airway Management Planned: Nasal Cannula  Additional Equipment:   Intra-op Plan:   Post-operative Plan:   Informed Consent: I have reviewed the patients History and Physical, chart, labs and discussed the procedure including the risks, benefits and alternatives for the proposed anesthesia with the patient or authorized representative who has indicated his/her understanding and acceptance.     Plan Discussed with:   Anesthesia Plan Comments:         Anesthesia Quick Evaluation

## 2011-09-12 ENCOUNTER — Encounter (HOSPITAL_COMMUNITY)
Admission: RE | Disposition: A | Discharge status: Home / Self Care | Payer: Self-pay | Source: Ambulatory Visit | Attending: Podiatry

## 2011-09-12 ENCOUNTER — Encounter (HOSPITAL_COMMUNITY): Payer: Self-pay | Admitting: Anesthesiology

## 2011-09-12 ENCOUNTER — Other Ambulatory Visit: Payer: Self-pay

## 2011-09-12 ENCOUNTER — Encounter (HOSPITAL_COMMUNITY): Payer: Self-pay | Admitting: *Deleted

## 2011-09-12 ENCOUNTER — Ambulatory Visit (HOSPITAL_COMMUNITY): Payer: BC Managed Care – PPO

## 2011-09-12 ENCOUNTER — Ambulatory Visit (HOSPITAL_COMMUNITY)
Admission: RE | Admit: 2011-09-12 | Discharge: 2011-09-12 | Disposition: A | Discharge status: Home / Self Care | Payer: BC Managed Care – PPO | Source: Ambulatory Visit | Attending: Podiatry | Admitting: Podiatry

## 2011-09-12 DIAGNOSIS — M7730 Calcaneal spur, unspecified foot: Secondary | ICD-10-CM | POA: Insufficient documentation

## 2011-09-12 DIAGNOSIS — I10 Essential (primary) hypertension: Secondary | ICD-10-CM | POA: Insufficient documentation

## 2011-09-12 DIAGNOSIS — M79671 Pain in right foot: Secondary | ICD-10-CM

## 2011-09-12 DIAGNOSIS — Z9889 Other specified postprocedural states: Secondary | ICD-10-CM | POA: Insufficient documentation

## 2011-09-12 DIAGNOSIS — E119 Type 2 diabetes mellitus without complications: Secondary | ICD-10-CM | POA: Insufficient documentation

## 2011-09-12 DIAGNOSIS — Z79899 Other long term (current) drug therapy: Secondary | ICD-10-CM | POA: Insufficient documentation

## 2011-09-12 DIAGNOSIS — Z01812 Encounter for preprocedural laboratory examination: Secondary | ICD-10-CM | POA: Insufficient documentation

## 2011-09-12 DIAGNOSIS — M7660 Achilles tendinitis, unspecified leg: Secondary | ICD-10-CM | POA: Insufficient documentation

## 2011-09-12 HISTORY — PX: BONE EXOSTOSIS EXCISION: SHX1249

## 2011-09-12 SURGERY — EXCISION, EXOSTOSIS
Anesthesia: Monitor Anesthesia Care | Site: Ankle | Laterality: Right | Wound class: Clean

## 2011-09-12 MED ORDER — LACTATED RINGERS IV SOLN
INTRAVENOUS | Status: DC
  Administered 2011-09-12: 1000 mL via INTRAVENOUS

## 2011-09-12 MED ORDER — FENTANYL CITRATE 0.05 MG/ML IJ SOLN: 25.0000 ug | INTRAMUSCULAR | Status: DC | PRN

## 2011-09-12 MED ORDER — CEFAZOLIN SODIUM-DEXTROSE 2-3 GM-% IV SOLR
INTRAVENOUS | Status: AC
  Filled 2011-09-12: qty 50

## 2011-09-12 MED ORDER — ONDANSETRON HCL 4 MG/2ML IJ SOLN: 4.0000 mg | Freq: Once | INTRAMUSCULAR | Status: DC | PRN

## 2011-09-12 MED ORDER — BUPIVACAINE HCL (PF) 0.5 % IJ SOLN
INTRAMUSCULAR | Status: AC
  Filled 2011-09-12: qty 30

## 2011-09-12 MED ORDER — LIDOCAINE HCL (PF) 1 % IJ SOLN
INTRAMUSCULAR | Status: AC
  Filled 2011-09-12: qty 5

## 2011-09-12 MED ORDER — FENTANYL CITRATE 0.05 MG/ML IJ SOLN
INTRAMUSCULAR | Status: AC
  Filled 2011-09-12: qty 2

## 2011-09-12 MED ORDER — MIDAZOLAM HCL 2 MG/2ML IJ SOLN
INTRAMUSCULAR | Status: AC
  Filled 2011-09-12: qty 2

## 2011-09-12 MED ORDER — CEFAZOLIN SODIUM 1-5 GM-% IV SOLN
INTRAVENOUS | Status: DC | PRN
  Administered 2011-09-12: 2 g via INTRAVENOUS

## 2011-09-12 MED ORDER — LIDOCAINE HCL (PF) 1 % IJ SOLN
INTRAMUSCULAR | Status: DC | PRN
  Administered 2011-09-12: 30 mL

## 2011-09-12 MED ORDER — PROPOFOL 10 MG/ML IV EMUL
INTRAVENOUS | Status: AC
  Filled 2011-09-12: qty 20

## 2011-09-12 MED ORDER — MIDAZOLAM HCL 2 MG/2ML IJ SOLN
1.0000 mg | INTRAMUSCULAR | Status: DC | PRN
  Administered 2011-09-12: 2 mg via INTRAVENOUS

## 2011-09-12 MED ORDER — BUPIVACAINE HCL (PF) 0.5 % IJ SOLN
INTRAMUSCULAR | Status: DC | PRN
  Administered 2011-09-12: 20 mL

## 2011-09-12 MED ORDER — LIDOCAINE HCL (PF) 1 % IJ SOLN
INTRAMUSCULAR | Status: AC
  Filled 2011-09-12: qty 30

## 2011-09-12 MED ORDER — PROPOFOL 10 MG/ML IV EMUL
INTRAVENOUS | Status: DC | PRN
  Administered 2011-09-12: 25 ug/kg/min via INTRAVENOUS

## 2011-09-12 MED ORDER — CEFAZOLIN SODIUM-DEXTROSE 2-3 GM-% IV SOLR: 2.0000 g | INTRAVENOUS | Status: DC

## 2011-09-12 MED ORDER — FENTANYL CITRATE 0.05 MG/ML IJ SOLN
INTRAMUSCULAR | Status: DC | PRN
  Administered 2011-09-12 (×2): 50 ug via INTRAVENOUS

## 2011-09-12 MED ORDER — PROPOFOL 10 MG/ML IV EMUL
INTRAVENOUS | Status: DC | PRN
  Administered 2011-09-12: 20 mg via INTRAVENOUS
  Administered 2011-09-12: 10 mg via INTRAVENOUS
  Administered 2011-09-12: 20 mg via INTRAVENOUS
  Administered 2011-09-12: 30 mg via INTRAVENOUS

## 2011-09-12 MED ORDER — LACTATED RINGERS IV SOLN
INTRAVENOUS | Status: DC | PRN
  Administered 2011-09-12: 07:00:00 via INTRAVENOUS

## 2011-09-12 SURGICAL SUPPLY — 50 items
ANCHOR SUT CORKSCREW 3.5X12 (Anchor) ×2 IMPLANT
BAG HAMPER (MISCELLANEOUS) ×2 IMPLANT
BANDAGE ELASTIC 4 VELCRO NS (GAUZE/BANDAGES/DRESSINGS) IMPLANT
BANDAGE ELASTIC 6 VELCRO NS (GAUZE/BANDAGES/DRESSINGS) IMPLANT
BANDAGE ESMARK 4X12 BL STRL LF (DISPOSABLE) ×1 IMPLANT
BANDAGE GAUZE ELAST BULKY 4 IN (GAUZE/BANDAGES/DRESSINGS) ×2 IMPLANT
BLADE AVERAGE 25X9 (BLADE) ×2 IMPLANT
BLADE OSC/SAG 18.5X9 THN (BLADE) IMPLANT
BLADE SURG 15 STRL LF DISP TIS (BLADE) ×1 IMPLANT
BLADE SURG 15 STRL SS (BLADE) ×1
BNDG ESMARK 4X12 BLUE STRL LF (DISPOSABLE) ×2
CHLORAPREP W/TINT 26ML (MISCELLANEOUS) ×2 IMPLANT
CLOTH BEACON ORANGE TIMEOUT ST (SAFETY) ×2 IMPLANT
COVER LIGHT HANDLE STERIS (MISCELLANEOUS) ×4 IMPLANT
CUFF TOURNIQUET SINGLE 18IN (TOURNIQUET CUFF) ×2 IMPLANT
CUFF TOURNIQUET SINGLE 34IN LL (TOURNIQUET CUFF) IMPLANT
DRAPE C-ARM FOLDED MOBILE STRL (DRAPES) IMPLANT
DRAPE OEC MINIVIEW 54X84 (DRAPES) ×2 IMPLANT
DRSG ADAPTIC 3X8 NADH LF (GAUZE/BANDAGES/DRESSINGS) ×2 IMPLANT
DURA STEPPER LG (CAST SUPPLIES) IMPLANT
DURA STEPPER MED (CAST SUPPLIES) ×2 IMPLANT
DURA STEPPER SML (CAST SUPPLIES) IMPLANT
DURA STEPPER XL (SOFTGOODS) IMPLANT
ELECT REM PT RETURN 9FT ADLT (ELECTROSURGICAL) ×2
ELECTRODE REM PT RTRN 9FT ADLT (ELECTROSURGICAL) ×1 IMPLANT
GLOVE BIO SURGEON STRL SZ7.5 (GLOVE) ×2 IMPLANT
GLOVE ECLIPSE 6.5 STRL STRAW (GLOVE) ×4 IMPLANT
GLOVE EXAM NITRILE XS STR PU (GLOVE) ×2 IMPLANT
GLOVE INDICATOR 7.0 STRL GRN (GLOVE) ×4 IMPLANT
GOWN BRE IMP SLV AUR XL STRL (GOWN DISPOSABLE) ×6 IMPLANT
INST SET MINOR BONE (KITS) ×2 IMPLANT
KIT ROOM TURNOVER APOR (KITS) ×2 IMPLANT
MANIFOLD NEPTUNE II (INSTRUMENTS) ×2 IMPLANT
NEEDLE HYPO 18GX1.5 BLUNT FILL (NEEDLE) IMPLANT
NEEDLE HYPO 27GX1-1/4 (NEEDLE) ×6 IMPLANT
NS IRRIG 1000ML POUR BTL (IV SOLUTION) ×2 IMPLANT
PACK BASIC LIMB (CUSTOM PROCEDURE TRAY) ×2 IMPLANT
PAD ARMBOARD 7.5X6 YLW CONV (MISCELLANEOUS) ×2 IMPLANT
RASP SM TEAR CROSS CUT (RASP) ×2 IMPLANT
SET BASIN LINEN APH (SET/KITS/TRAYS/PACK) ×2 IMPLANT
SPLINT J IMMOBILIZER 4X20FT (CAST SUPPLIES) IMPLANT
SPLINT J PLASTER J 4INX20Y (CAST SUPPLIES)
SPONGE GAUZE 4X4 12PLY (GAUZE/BANDAGES/DRESSINGS) ×2 IMPLANT
STRIP CLOSURE SKIN 1/2X4 (GAUZE/BANDAGES/DRESSINGS) ×2 IMPLANT
SUT BONE WAX W31G (SUTURE) IMPLANT
SUT MON AB 5-0 PS2 18 (SUTURE) ×2 IMPLANT
SUT PROLENE 5 0 PS 3 (SUTURE) ×6 IMPLANT
SUT VIC AB 4-0 PS2 27 (SUTURE) ×2 IMPLANT
SYR CONTROL 10ML LL (SYRINGE) ×6 IMPLANT
TOWEL OR 17X26 4PK STRL BLUE (TOWEL DISPOSABLE) ×2 IMPLANT

## 2011-09-12 NOTE — Anesthesia Postprocedure Evaluation (Signed)
  Anesthesia Post-op Note  Patient: Jennifer [name]  Procedure(s) Performed:  EXOSTOSIS EXCISION - Retrocalcaneal Exostectomy Right Foot  Patient Location: PACU  Anesthesia Type: MAC  Level of Consciousness: awake, alert  and oriented  Airway and Oxygen Therapy: Patient Spontanous Breathing  Post-op Pain: none  Post-op Assessment: Post-op Vital signs reviewed, Patient's Cardiovascular Status Stable, Respiratory Function Stable and No signs of Nausea or vomiting  Post-op Vital Signs: Reviewed and stable  Complications: No apparent anesthesia complications

## 2011-09-12 NOTE — Telephone Encounter (Signed)
Pt aware of improved potassium, I avised take 3 OTC potassium tabs today then 2 daily

## 2011-09-12 NOTE — Telephone Encounter (Signed)
Labs have been sent

## 2011-09-12 NOTE — Op Note (Signed)
NAMEZEN, FELLING              ACCOUNT NO.:  [id number]  MEDICAL RECORD NO.:  [id number]  LOCATION:  [facility]                          FACILITY:  [facility]  PHYSICIAN:  B. Theola Sequin, MD   DATE OF BIRTH:  31-May-1956  DATE OF PROCEDURE:  09/12/2011 DATE OF DISCHARGE:  09/12/2011                              OPERATIVE REPORT   SURGEON:  B. Theola Sequin, MD  ASSISTANT:  Valetta Close.  PREOPERATIVE DIAGNOSIS:  Retrocalcaneal exostosis with associated Achilles tendinitis of the right lower extremity.  POSTOPERATIVE DIAGNOSIS:  Retrocalcaneal exostosis with associated Achilles tendinitis of the right lower extremity.  PROCEDURE:  Retrocalcaneal exostectomy of the right lower extremity.  ANESTHESIA:  MAC with local, hemostasis, pneumatic calf tourniquet at 250 mmHg.  ESTIMATED BLOOD LOSS:  Approximately 20 mL, injectable 0.5% Marcaine plain, and 1% Xylocaine plain.  PATHOLOGY:  Retrocalcaneal spur.  COMPLICATIONS:  None.  PROCEDURE IN DETAIL:  The patient was brought to the operating room and placed in the prone position.  The right lower extremity was anesthetized using 0.5% Marcaine plain.  The foot was scrubbed, prepped, and draped in usual sterile manner.  The limb was then elevated, exsanguinated, and the pneumatic calf tourniquet inflated to 250 mmHg. Attention was directed to the posterior aspect of the right leg where a linear longitudinal incision was made along the posterior aspect of the Achilles tendon slightly medial to the midline.  Incision was continued deep down to the level of the peritenon and incision was made through the peritenon which was reflected medial and laterally.  A linear incision was made through the Achilles tendon and the central portion of the Achilles tendon.  Most central portions of the Achilles tendon were reflected medially and laterally thus exposing the retrocalcaneal spur at the operative site.  The most medial and lateral attachments  of the Achilles tendon remained intact.  The power bone spur was used to resect the retrocalcaneal spur which was passed from the operative field and sent to pathology for evaluation.  All bone edges were smoothed with a power rasp.  Resection of the heel spur was resected.  It was evaluated with C-arm fluoroscopy and noted to be adequate.  Using a 3.5 mm corkscrew Arthrex anchor, the Achilles tendon was reattached to the posterior aspect of the calcaneus.  Appropriate tension was placed on the Achilles tendon.  The wound was irrigated with copious amounts of sterile irrigant.  The peritenon and subcutaneous structure reapproximated using 4-0 Vicryl.  Skin was reapproximated using 5-0 Monocryl in a running subcuticular manner and reinforced with 5-0 Prolene in a horizontal mattress and simple suture technique.  Steri-Strips were applied to reinforce the incision closure.  Sterile compressive dressing was then applied to the right ankle.  A pneumatic calf tourniquet deflated and a prompt hyperemic response was noted to all digits of the right foot.          ______________________________ B. Theola Sequin, MD     BIM/MEDQ  D:  09/12/2011  T:  09/12/2011  Job:  [id number]

## 2011-09-12 NOTE — Interval H&P Note (Signed)
History and Physical Interval Note:   09/12/2011   7:24 AM   Jennifer [name]  has presented today for surgery, with the diagnosis of retrocalcaneal exostosis right foot, achilles tendonitis right lower extremity  The various methods of treatment have been discussed with the patient and family. After consideration of risks, benefits and other options for treatment, the patient has consented to  Procedure(s):  Retrocalcaneal exostectomy of the right lower extremity as a surgical intervention .  The patients' history has been reviewed, patient examined, no change in status, stable for surgery.  I have reviewed the patients' chart and labs.  Questions were answered to the patient's satisfaction.    Patient Vitals for the past 24 hrs:  BP Temp Temp src Pulse Resp SpO2  09/12/11 0650 131/76 mmHg 98.6 F (37 C) Oral 57  18  97 %   A history and physical examination was performed in my office.  There have been no changes to this history and physical examination.   Dallas Schimke, DPM

## 2011-09-12 NOTE — Brief Op Note (Signed)
09/12/2011  9:33 AM  PATIENT:  Jennifer [name]  56 y.o. female  PRE-OPERATIVE DIAGNOSIS:  Retrocalcaneal exostosis right foot, Achilles tendonitis right lower extremity  POST-OPERATIVE DIAGNOSIS:  Retrocalcaneal exostosis right foot, Achilles tendonitis right lower extremity  PROCEDURE:  Procedure(s): Retrocalcaneal exostosis of the right foot.  SURGEON:  Dallas Schimke, DPM  ASSISTANT:  Valetta Close, RN  ANESTHESIA:   MAC with local  EBL:  Total I/O In: 600 [I.V.:600] Out: 20 [Blood:20]  BLOOD ADMINISTERED:none  DRAINS: none   LOCAL MEDICATIONS USED:  MARCAINE 20 CC and LIDOCAINE 30 CC  SPECIMEN:  Excision  DISPOSITION OF SPECIMEN:  PATHOLOGY  COUNTS:  YES  TOURNIQUET:   Total Tourniquet Time Documented: Calf (laterality) - 43 minutes  DICTATION: .Other Dictation: Dictation Number [id number]  PLAN OF CARE: Discharge to home after PACU  PATIENT DISPOSITION:  PACU - hemodynamically stable.   Delay start of Pharmacological VTE agent (>24hrs) due to surgical blood loss or risk of bleeding:  no

## 2011-09-12 NOTE — Transfer of Care (Signed)
Immediate Anesthesia Transfer of Care Note  Patient: Jennifer [name]  Procedure(s) Performed:  EXOSTOSIS EXCISION - Retrocalcaneal Exostectomy Right Foot  Patient Location: PACU  Anesthesia Type: MAC  Level of Consciousness: awake, alert  and oriented  Airway & Oxygen Therapy: Patient Spontanous Breathing  Post-op Assessment: Report given to PACU RN  Post vital signs: Reviewed and stable  Complications: No apparent anesthesia complications

## 2011-09-15 NOTE — Assessment & Plan Note (Signed)
Uncontrolled dose of fluoxetine improved

## 2011-09-15 NOTE — Assessment & Plan Note (Signed)
Controlled, no change in medication  

## 2011-09-15 NOTE — Assessment & Plan Note (Signed)
Improved. Pt applauded on succesful weight loss through lifestyle change, and encouraged to continue same. Weight loss goal set for the next several months.  

## 2011-09-15 NOTE — Assessment & Plan Note (Signed)
Hyperlipidemia:Low fat diet discussed and encouraged. Labs are upcoming

## 2011-09-15 NOTE — Progress Notes (Signed)
  Subjective:    Patient ID: Jennifer [name], female    DOB: [birth year], 55 y.o.   MRN: [id number]  HPI The PT is here for follow up and re-evaluation of chronic medical conditions, medication management and review of any available recent lab and radiology data.  Preventive health is updated, specifically  Cancer screening and Immunization.   Questions or concerns regarding consultations or procedures which the PT has had in the interim are  Addressed.She has upcoming surgery on her foot, her potassium when checked 1 day ago was low and she is on supplement. At the end of the visit pt asks "if I will clear her for surgery" i have had no correspondence form the surgeon in this regard, review of recent EKG , CXR and BP are good The PT denies any adverse reactions to current medications since the last visit.  There are no new concerns.  States she continues to have uncontrolled mood swings     Review of Systems See HPI Denies recent fever or chills. Denies sinus pressure, nasal congestion, ear pain or sore throat. Denies chest congestion, productive cough or wheezing. Denies chest pains, palpitations and leg swelling Denies abdominal pain, nausea, vomiting,diarrhea or constipation.   Denies dysuria, frequency, hesitancy or incontinence.  Denies headaches, seizures, numbness, or tingling. Denies skin break down or rash.        Objective:   Physical Exam Patient alert and oriented and in no cardiopulmonary distress.  HEENT: No facial asymmetry, EOMI, no sinus tenderness,  oropharynx pink and moist.  Neck supple no adenopathy.  Chest: Clear to auscultation bilaterally.  CVS: S1, S2 no murmurs, no S3.  ABD: Soft non tender. Bowel sounds normal.  Ext: No edema  MS: Adequate ROM spine, shoulders, hips and knees.  Skin: Intact, no ulcerations or rash noted.  Psych: Good eye contact, normal affect. Memory intact not anxious or depressed appearing.  CNS: CN 2-12 intact, power,  tone and sensation normal throughout.        Assessment & Plan:

## 2011-09-17 ENCOUNTER — Encounter (HOSPITAL_COMMUNITY): Payer: Self-pay | Admitting: Podiatry

## 2011-09-24 ENCOUNTER — Other Ambulatory Visit: Payer: Self-pay | Admitting: Family Medicine

## 2011-12-16 ENCOUNTER — Other Ambulatory Visit: Payer: Self-pay | Admitting: Family Medicine

## 2012-01-14 ENCOUNTER — Ambulatory Visit (INDEPENDENT_AMBULATORY_CARE_PROVIDER_SITE_OTHER): Payer: BC Managed Care – PPO | Admitting: Family Medicine

## 2012-01-14 ENCOUNTER — Encounter: Payer: Self-pay | Admitting: Family Medicine

## 2012-01-14 VITALS — BP 114/74 | HR 68 | Resp 18 | Ht 66.0 in | Wt 217.0 lb

## 2012-01-14 DIAGNOSIS — E669 Obesity, unspecified: Secondary | ICD-10-CM

## 2012-01-14 DIAGNOSIS — R7309 Other abnormal glucose: Secondary | ICD-10-CM

## 2012-01-14 DIAGNOSIS — F329 Major depressive disorder, single episode, unspecified: Secondary | ICD-10-CM

## 2012-01-14 DIAGNOSIS — F3289 Other specified depressive episodes: Secondary | ICD-10-CM

## 2012-01-14 DIAGNOSIS — E785 Hyperlipidemia, unspecified: Secondary | ICD-10-CM

## 2012-01-14 DIAGNOSIS — I10 Essential (primary) hypertension: Secondary | ICD-10-CM

## 2012-01-14 DIAGNOSIS — G4700 Insomnia, unspecified: Secondary | ICD-10-CM

## 2012-01-14 MED ORDER — ATENOLOL-CHLORTHALIDONE 50-25 MG PO TABS: 1.0000 | ORAL_TABLET | Freq: Every day | ORAL | Status: DC

## 2012-01-14 MED ORDER — FLUOXETINE HCL 20 MG PO CAPS: 20.0000 mg | ORAL_CAPSULE | Freq: Every day | ORAL | Status: DC

## 2012-01-14 MED ORDER — BUPROPION HCL ER (XL) 150 MG PO TB24: 150.0000 mg | ORAL_TABLET | ORAL | Status: DC

## 2012-01-14 MED ORDER — TEMAZEPAM 15 MG PO CAPS: 15.0000 mg | ORAL_CAPSULE | Freq: Every evening | ORAL | Status: DC | PRN

## 2012-01-14 NOTE — Patient Instructions (Signed)
F/u in 4 month  Congrats on all the great plans you have for a family, I believe this will be wonderful.  Fasting lipid, cmp , and hBA1C this week please  It is important that you exercise regularly at least 30 minutes 5 times a week. If you develop chest pain, have severe difficulty breathing, or feel very tired, stop exercising immediately and seek medical attention   A healthy diet is rich in fruit, vegetables and whole grains. Poultry fish, nuts and beans are a healthy choice for protein rather then red meat. A low sodium diet and drinking 64 ounces of water daily is generally recommended. Oils and sweet should be limited. Carbohydrates especially for those who are diabetic or overweight, should be limited to 30-45 gram per meal. It is important to eat on a regular schedule, at least 3 times daily. Snacks should be primarily fruits, vegetables or nuts.

## 2012-01-16 ENCOUNTER — Encounter: Payer: Self-pay | Admitting: Family Medicine

## 2012-01-16 LAB — LIPID PANEL
Cholesterol: 283 mg/dL — ABNORMAL HIGH (ref 0–200)
HDL: 60 mg/dL
LDL Cholesterol: 170 mg/dL — ABNORMAL HIGH (ref 0–99)
Total CHOL/HDL Ratio: 4.7 ratio
Triglycerides: 265 mg/dL — ABNORMAL HIGH
VLDL: 53 mg/dL — ABNORMAL HIGH (ref 0–40)

## 2012-01-16 LAB — COMPLETE METABOLIC PANEL WITHOUT GFR
ALT: 33 U/L (ref 0–35)
AST: 41 U/L — ABNORMAL HIGH (ref 0–37)
Albumin: 4.1 g/dL (ref 3.5–5.2)
Alkaline Phosphatase: 110 U/L (ref 39–117)
BUN: 24 mg/dL — ABNORMAL HIGH (ref 6–23)
CO2: 28 meq/L (ref 19–32)
Calcium: 10.2 mg/dL (ref 8.4–10.5)
Chloride: 101 meq/L (ref 96–112)
Creat: 1.26 mg/dL — ABNORMAL HIGH (ref 0.50–1.10)
GFR, Est African American: 55 mL/min — ABNORMAL LOW
GFR, Est Non African American: 48 mL/min — ABNORMAL LOW
Glucose, Bld: 129 mg/dL — ABNORMAL HIGH (ref 70–99)
Potassium: 4.1 meq/L (ref 3.5–5.3)
Sodium: 141 meq/L (ref 135–145)
Total Bilirubin: 0.5 mg/dL (ref 0.3–1.2)
Total Protein: 7.3 g/dL (ref 6.0–8.3)

## 2012-01-16 LAB — HEMOGLOBIN A1C
Hgb A1c MFr Bld: 6.5 % — ABNORMAL HIGH
Mean Plasma Glucose: 140 mg/dL — ABNORMAL HIGH

## 2012-01-16 NOTE — Assessment & Plan Note (Signed)
Deteriorated. Patient re-educated about  the importance of commitment to a  minimum of 150 minutes of exercise per week. The importance of healthy food choices with portion control discussed. Encouraged to start a food diary, count calories and to consider  joining a support group. Sample diet sheets offered. Goals set by the patient for the next several months.    

## 2012-01-16 NOTE — Assessment & Plan Note (Signed)
Diet controlled prediabetic/diabetic, need updated HBa1C  Low carb diet stressed

## 2012-01-16 NOTE — Progress Notes (Signed)
  Subjective:    Patient ID: Jennifer [name], female    DOB: 30-Nov-1955, 56 y.o.   MRN: [id number]  HPI The PT is here for follow up and re-evaluation of chronic medical conditions, medication management and review of any available recent lab and radiology data.  Preventive health is updated, specifically  Cancer screening and Immunization.   Questions or concerns regarding consultations or procedures which the PT has had in the interim are  Addressed.Had sucesful right foot surgery, and has recovered well. Unfortunately gained weight in recovery, but will work on this The PT denies any adverse reactions to current medications since the last visit.  She is very excited about  an upcoming adoption of a teen girl who has been ostracized by her family because she is a lesbian, Jennifer [name] and her partner feel they could provide a loving home environment and she is applying top be a foster parent. She denies polyuria, polydipsia or blurred vision       Review of Systems See HPI Denies recent fever or chills. Denies sinus pressure, nasal congestion, ear pain or sore throat. Denies chest congestion, productive cough or wheezing. Denies chest pains, palpitations and leg swelling Denies abdominal pain, nausea, vomiting,diarrhea or constipation.   Denies dysuria, frequency, hesitancy or incontinence. Denies joint pain, swelling and imitation in mobility. Denies headaches, seizures, numbness, or tingling. Denies depression, anxiety or insomnia. Denies skin break down or rash.        Objective:   Physical Exam Patient alert and oriented and in no cardiopulmonary distress.  HEENT: No facial asymmetry, EOMI, no sinus tenderness,  oropharynx pink and moist.  Neck supple no adenopathy.  Chest: Clear to auscultation bilaterally.  CVS: S1, S2 no murmurs, no S3.  ABD: Soft non tender. Bowel sounds normal.  Ext: No edema  MS: Adequate ROM spine, shoulders, hips and knees.  Skin: Intact, no  ulcerations or rash noted.Well healed surgical scar on ankle Psych: Good eye contact, normal affect. Memory intact not anxious or depressed appearing.  CNS: CN 2-12 intact, power, tone and sensation normal throughout.        Assessment & Plan:

## 2012-01-16 NOTE — Assessment & Plan Note (Signed)
Controlled, no change in medication  

## 2012-01-16 NOTE — Assessment & Plan Note (Signed)
Hyperlipidemia:Low fat diet discussed and encouraged.  Labs past due, will be updated in the next 2 days

## 2012-01-17 NOTE — Progress Notes (Signed)
Addended by: Kandis Fantasia B on: 01/17/2012 02:45 PM   Modules accepted: Orders

## 2012-02-10 ENCOUNTER — Telehealth: Payer: Self-pay | Admitting: Family Medicine

## 2012-02-10 NOTE — Telephone Encounter (Signed)
pls type a letter stating in my medical opinion , her depression is adequately controlled , and she is capable of raising a child, I will sign.

## 2012-02-11 NOTE — Telephone Encounter (Signed)
Letter typed and ready to be signed

## 2012-03-16 ENCOUNTER — Other Ambulatory Visit: Payer: Self-pay | Admitting: Family Medicine

## 2012-04-09 ENCOUNTER — Other Ambulatory Visit: Payer: Self-pay | Admitting: Family Medicine

## 2012-05-13 ENCOUNTER — Other Ambulatory Visit: Payer: Self-pay | Admitting: Family Medicine

## 2012-05-18 ENCOUNTER — Ambulatory Visit: Payer: BC Managed Care – PPO | Admitting: Family Medicine

## 2012-05-21 ENCOUNTER — Other Ambulatory Visit: Payer: Self-pay | Admitting: Family Medicine

## 2012-05-22 LAB — HEPATIC FUNCTION PANEL
ALT: 16 U/L (ref 0–35)
AST: 22 U/L (ref 0–37)
Alkaline Phosphatase: 63 U/L (ref 39–117)
Indirect Bilirubin: 0.4 mg/dL (ref 0.0–0.9)
Total Protein: 7.2 g/dL (ref 6.0–8.3)

## 2012-05-22 LAB — LIPID PANEL
Cholesterol: 212 mg/dL — ABNORMAL HIGH (ref 0–200)
HDL: 49 mg/dL (ref >39)
Triglycerides: 247 mg/dL — ABNORMAL HIGH (ref <150)

## 2012-05-23 ENCOUNTER — Other Ambulatory Visit: Payer: Self-pay | Admitting: Family Medicine

## 2012-05-26 ENCOUNTER — Ambulatory Visit (INDEPENDENT_AMBULATORY_CARE_PROVIDER_SITE_OTHER): Payer: BC Managed Care – PPO | Admitting: Family Medicine

## 2012-05-26 ENCOUNTER — Encounter: Payer: Self-pay | Admitting: Family Medicine

## 2012-05-26 VITALS — BP 120/80 | HR 67 | Resp 16 | Ht 66.0 in | Wt 211.1 lb

## 2012-05-26 DIAGNOSIS — F329 Major depressive disorder, single episode, unspecified: Secondary | ICD-10-CM

## 2012-05-26 DIAGNOSIS — E669 Obesity, unspecified: Secondary | ICD-10-CM

## 2012-05-26 DIAGNOSIS — Z1211 Encounter for screening for malignant neoplasm of colon: Secondary | ICD-10-CM

## 2012-05-26 DIAGNOSIS — G4700 Insomnia, unspecified: Secondary | ICD-10-CM

## 2012-05-26 DIAGNOSIS — E119 Type 2 diabetes mellitus without complications: Secondary | ICD-10-CM

## 2012-05-26 DIAGNOSIS — E559 Vitamin D deficiency, unspecified: Secondary | ICD-10-CM

## 2012-05-26 DIAGNOSIS — R5381 Other malaise: Secondary | ICD-10-CM

## 2012-05-26 DIAGNOSIS — E785 Hyperlipidemia, unspecified: Secondary | ICD-10-CM

## 2012-05-26 DIAGNOSIS — I10 Essential (primary) hypertension: Secondary | ICD-10-CM

## 2012-05-26 DIAGNOSIS — R5383 Other fatigue: Secondary | ICD-10-CM

## 2012-05-26 NOTE — Progress Notes (Signed)
  Subjective:    Patient ID: Jennifer [name], female    DOB: May 19, 1956, 56 y.o.   MRN: [id number]  HPI The PT is here for follow up and re-evaluation of chronic medical conditions, medication management and review of any available recent lab and radiology data.  Preventive health is updated, specifically  Cancer screening and Immunization.   Questions or concerns regarding consultations or procedures which the PT has had in the interim are  Addressed.Recently saw her gynecologist and has started topical and oral hormone replacement, denies any known fam h/o breast cancer, states she feels much better, as her libido was extremely poor. Remains very pleased with results of foot surgery earlier this year. More active, and has focused on dietary change a lot with improved blood sugars and weight. Disappointed in adoption, child they were trying to get would never be able to live independently, so they are again on the search, she has been a[pproved to adopt however The PT denies any adverse reactions to current medications since the last visit.  There are no new concerns.  There are no specific complaints      Review of Systems Denies recent fever or chills. Denies sinus pressure, nasal congestion, ear pain or sore throat. Denies chest congestion, productive cough or wheezing. Denies chest pains, palpitations and leg swelling Denies abdominal pain, nausea, vomiting,diarrhea or constipation.   Denies dysuria, frequency, hesitancy or incontinence. Denies joint pain, swelling and limitation in mobility. Denies headaches, seizures, numbness, or tingling. Denies depression, anxiety or insomnia. Denies skin break down or rash.        Objective:   Physical Exam  Patient alert and oriented and in no cardiopulmonary distress.  HEENT: No facial asymmetry, EOMI, no sinus tenderness,  oropharynx pink and moist.  Neck supple no adenopathy.  Chest: Clear to auscultation bilaterally.  CVS: S1,  S2 no murmurs, no S3.  ABD: Soft non tender. Bowel sounds normal.  Ext: No edema  MS: Adequate ROM spine, shoulders, hips and knees.  Skin: Intact, no ulcerations or rash noted.  Psych: Good eye contact, normal affect. Memory intact not anxious or depressed appearing.  CNS: CN 2-12 intact, power, tone and sensation normal throughout.       Assessment & Plan:

## 2012-05-26 NOTE — Patient Instructions (Addendum)
F/u in 4 month  Please call if you need me before.  PLEASE schedule your mammogram, this is past due.   You are referred for average risk screening colonoscopy with Dr. Russella Dar in [facility] on marked improvement in blood work and weight loss, also blood pressure is excellent!  Keep all these GREAT things up  Please change to greek yogurt, and also cut back A LOT on cheese  Fasting lipid, cmp , TSH, cbc, vit D , HBA1C in 4 months before next  visit

## 2012-05-26 NOTE — Assessment & Plan Note (Signed)
Controlled, no change in medication  

## 2012-05-26 NOTE — Assessment & Plan Note (Signed)
Improved, controlled with diet

## 2012-05-26 NOTE — Assessment & Plan Note (Signed)
Improved. Pt applauded on succesful weight loss through lifestyle change, and encouraged to continue same. Weight loss goal set for the next several months.  

## 2012-05-26 NOTE — Assessment & Plan Note (Signed)
Marked improvement, pt to continue medication, and follow low fat diet

## 2012-06-01 ENCOUNTER — Encounter: Payer: Self-pay | Admitting: Gastroenterology

## 2012-07-06 ENCOUNTER — Ambulatory Visit (AMBULATORY_SURGERY_CENTER): Payer: BC Managed Care – PPO | Admitting: *Deleted

## 2012-07-06 VITALS — Ht 66.0 in | Wt 213.0 lb

## 2012-07-06 DIAGNOSIS — Z1211 Encounter for screening for malignant neoplasm of colon: Secondary | ICD-10-CM

## 2012-07-06 MED ORDER — MOVIPREP 100 G PO SOLR: 1.0000 | Freq: Once | ORAL | Status: DC

## 2012-07-07 ENCOUNTER — Encounter: Payer: Self-pay | Admitting: Gastroenterology

## 2012-07-20 ENCOUNTER — Ambulatory Visit (AMBULATORY_SURGERY_CENTER): Payer: BC Managed Care – PPO | Admitting: Gastroenterology

## 2012-07-20 ENCOUNTER — Encounter: Payer: Self-pay | Admitting: Gastroenterology

## 2012-07-20 VITALS — BP 123/55 | HR 74 | Temp 97.6°F | Resp 16 | Ht 66.0 in | Wt 213.0 lb

## 2012-07-20 DIAGNOSIS — D126 Benign neoplasm of colon, unspecified: Secondary | ICD-10-CM

## 2012-07-20 DIAGNOSIS — Z1211 Encounter for screening for malignant neoplasm of colon: Secondary | ICD-10-CM

## 2012-07-20 MED ORDER — SODIUM CHLORIDE 0.9 % IV SOLN: 500.0000 mL | INTRAVENOUS | Status: DC

## 2012-07-20 NOTE — Patient Instructions (Addendum)

## 2012-07-20 NOTE — Progress Notes (Signed)
Patient did not experience any of the following events: a burn prior to discharge; a fall within the facility; wrong site/side/patient/procedure/implant event; or a hospital transfer or hospital admission upon discharge from the facility. (G8907) Patient did not have preoperative order for IV antibiotic SSI prophylaxis. (G8918)  

## 2012-07-20 NOTE — Op Note (Signed)
 [facility] [address] [place], [address]   COLONOSCOPY PROCEDURE REPORT  PATIENT: Jennifer [name], Jennifer [name]  MR#: [id number] BIRTHDATE: 1955-11-27 , 56  yrs. old GENDER: Female ENDOSCOPIST: Meryl Dare, MD, [facility] REFERRED GN:[id number] Lodema Hong, M.D. PROCEDURE DATE:  07/20/2012 PROCEDURE:   Colonoscopy with snare polypectomy ASA CLASS:   Class II INDICATIONS:average risk screening. MEDICATIONS: MAC sedation, administered by CRNA, and propofol (Diprivan) 150mg  IV DESCRIPTION OF PROCEDURE:   After the risks benefits and alternatives of the procedure were thoroughly explained, informed consent was obtained.  A digital rectal exam revealed no abnormalities of the rectum.   The LB [id number]  endoscope was introduced through the anus and advanced to the cecum, which was identified by both the appendix and ileocecal valve. No adverse events experienced.   The quality of the prep was good, using MoviPrep  The instrument was then slowly withdrawn as the colon was fully examined.   COLON FINDINGS: A sessile polyp measuring 6 mm in size was found at the cecum.  A polypectomy was performed with a cold snare.  The resection was complete and the polyp tissue was completely retrieved.   The colon was otherwise normal.  There was no diverticulosis, inflamation, polyps or cancers unless previously stated.  Retroflexed views revealed internal hemorrhoids. The time to cecum=2 minutes 07 seconds.  Withdrawal time=9 minutes 16 seconds.  The scope was withdrawn and the procedure completed. COMPLICATIONS: There were no complications.  ENDOSCOPIC IMPRESSION: 1.   Sessile polyp at the cecum; polypectomy performed with a cold snare 2.   The colon was otherwise normal  RECOMMENDATIONS: 1.  Await pathology results 2.  Repeat colonoscopy in 5 years if polyp adenomatous; otherwise 10 years   eSigned:  Meryl Dare, MD, [facility] 07/20/2012 11:57 AM

## 2012-07-21 ENCOUNTER — Telehealth: Payer: Self-pay | Admitting: *Deleted

## 2012-07-21 NOTE — Telephone Encounter (Signed)
  Follow up Call-  Call back number 07/20/2012  Post procedure Call Back phone  # [phone] hm 0r [phone] cell  Permission to leave phone message Yes     Patient questions:  Do you have a fever, pain , or abdominal swelling? no Pain Score  0 *  Have you tolerated food without any problems?yes  Have you been able to return to your normal activities? yes  Do you have any questions about your discharge instructions: Diet   no Medications  no Follow up visit  no  Do you have questions or concerns about your Care? no  Actions: * If pain score is 4 or above: No action needed, pain <4.

## 2012-07-23 ENCOUNTER — Encounter: Payer: Self-pay | Admitting: Gastroenterology

## 2012-08-13 ENCOUNTER — Other Ambulatory Visit: Payer: Self-pay | Admitting: Family Medicine

## 2012-09-09 ENCOUNTER — Other Ambulatory Visit: Payer: Self-pay | Admitting: Family Medicine

## 2012-09-28 ENCOUNTER — Other Ambulatory Visit: Payer: Self-pay | Admitting: Family Medicine

## 2012-10-16 ENCOUNTER — Telehealth: Payer: Self-pay | Admitting: Family Medicine

## 2012-10-16 NOTE — Telephone Encounter (Signed)
Is ok to give over age 56, doubt ins will cover, explain and fax over stamped script pls

## 2012-10-16 NOTE — Telephone Encounter (Signed)
Is pt old enough for shingles shot? Ok to fax rx?

## 2012-10-16 NOTE — Telephone Encounter (Signed)
Pt aware.

## 2012-10-30 ENCOUNTER — Other Ambulatory Visit: Payer: Self-pay | Admitting: Family Medicine

## 2012-11-02 ENCOUNTER — Encounter: Payer: Self-pay | Admitting: Family Medicine

## 2012-11-02 ENCOUNTER — Telehealth: Payer: Self-pay | Admitting: Family Medicine

## 2012-11-02 ENCOUNTER — Ambulatory Visit (INDEPENDENT_AMBULATORY_CARE_PROVIDER_SITE_OTHER): Payer: BC Managed Care – PPO | Admitting: Family Medicine

## 2012-11-02 VITALS — BP 106/70 | HR 77 | Temp 98.7°F | Resp 18 | Ht 66.0 in | Wt 216.1 lb

## 2012-11-02 DIAGNOSIS — I10 Essential (primary) hypertension: Secondary | ICD-10-CM

## 2012-11-02 DIAGNOSIS — R1011 Right upper quadrant pain: Secondary | ICD-10-CM

## 2012-11-02 DIAGNOSIS — J329 Chronic sinusitis, unspecified: Secondary | ICD-10-CM

## 2012-11-02 DIAGNOSIS — E785 Hyperlipidemia, unspecified: Secondary | ICD-10-CM

## 2012-11-02 DIAGNOSIS — E669 Obesity, unspecified: Secondary | ICD-10-CM

## 2012-11-02 DIAGNOSIS — F329 Major depressive disorder, single episode, unspecified: Secondary | ICD-10-CM

## 2012-11-02 DIAGNOSIS — E119 Type 2 diabetes mellitus without complications: Secondary | ICD-10-CM

## 2012-11-02 MED ORDER — SULFAMETHOXAZOLE-TRIMETHOPRIM 800-160 MG PO TABS: 1.0000 | ORAL_TABLET | Freq: Two times a day (BID) | ORAL | Status: DC

## 2012-11-02 NOTE — Patient Instructions (Addendum)
F/u as before.   You are being treated for chronic sinusitis, 2 week course of antibiotic is sent to your pharmacy, please take entire course. Drink a lot of fluids and ensure you get sufficient rest.  I am sorry about your experience re getting an appointment, will follow up on this.  Please log into "My chart " to get access to your  labs and radiology reports, we strongly encourage this   Labs prior to next visit please.  Please reschedule your mammogram

## 2012-11-02 NOTE — Telephone Encounter (Signed)
Pt given appt

## 2012-11-02 NOTE — Telephone Encounter (Signed)
Work in this pm double book one slot please early in the pm

## 2012-11-02 NOTE — Progress Notes (Signed)
  Subjective:    Patient ID: Jennifer [name], female    DOB: 1956-10-09, 56 y.o.   MRN: [id number]  HPI 1 month h/o sinus pressure intermittently with fever and chills a lot of facial pressure, mainly over the cheeks, yellow post nasal drainage, bloody yellow sputum at times, teeth hurt.Intermittent chills, no do Has made 2 separate phone calls for visit since symptom onset with no success, states she is upset about this , and now that her symptoms have been present for a protracted time she feels much worse. Should be going out of town this weekend , but doubts she will be able to   Review of Systems See HPI Denies chest pains, palpitations and leg swelling Denies abdominal pain, nausea, vomiting,diarrhea or constipation.   Denies dysuria, frequency, hesitancy or incontinence. Denies joint pain, swelling and limitation in mobility. Denies headaches, seizures, numbness, or tingling. Denies depression, anxiety or insomnia. Denies skin break down or rash.        Objective:   Physical Exam        Assessment & Plan:

## 2012-11-09 ENCOUNTER — Other Ambulatory Visit: Payer: Self-pay | Admitting: Family Medicine

## 2012-11-09 ENCOUNTER — Ambulatory Visit: Payer: BC Managed Care – PPO

## 2012-11-09 ENCOUNTER — Telehealth: Payer: Self-pay | Admitting: Family Medicine

## 2012-11-09 DIAGNOSIS — J42 Unspecified chronic bronchitis: Secondary | ICD-10-CM

## 2012-11-09 DIAGNOSIS — R5383 Other fatigue: Secondary | ICD-10-CM

## 2012-11-09 MED ORDER — BENZONATATE 100 MG PO CAPS: 100.0000 mg | ORAL_CAPSULE | Freq: Four times a day (QID) | ORAL | Status: DC | PRN

## 2012-11-09 MED ORDER — LEVOFLOXACIN 500 MG PO TABS: 500.0000 mg | ORAL_TABLET | Freq: Every day | ORAL | Status: AC

## 2012-11-09 NOTE — Telephone Encounter (Signed)
Still not seeing any improvement and she had to postpone her out of town trip because she was feeling bad and weak. Still having the green mucus- has been taking the antibiotic for a week now

## 2012-11-09 NOTE — Telephone Encounter (Signed)
Lab ordered and pt coming in at 2:30 to get injection. Does not want the CXR but will have the labs done

## 2012-11-09 NOTE — Telephone Encounter (Signed)
Sorry to hear, I advise cbc and diff today, also CXR, sputum for c/s , oV for rocephin 500mg  im by nursing only. Also will send in levaquin , she is to take along with the septra. I am entering the med and CXR, pls order labs and administertrocephin 500mg  im today if possible

## 2012-11-09 NOTE — Telephone Encounter (Signed)
I also sent,  in tessalon perles,. pls let her know

## 2012-11-16 NOTE — Assessment & Plan Note (Signed)
Deteriorated. Patient re-educated about  the importance of commitment to a  minimum of 150 minutes of exercise per week. The importance of healthy food choices with portion control discussed. Encouraged to start a food diary, count calories and to consider  joining a support group. Sample diet sheets offered. Goals set by the patient for the next several months.    

## 2012-11-16 NOTE — Assessment & Plan Note (Signed)
Diet controlled only at this time. The importance of keeping carbohydrate intake down as well as attempting weight loss seriously through calorie restriction and physical activity is stressed Updated labs next visit

## 2012-11-16 NOTE — Assessment & Plan Note (Signed)
Controlled, no change in medication DASH diet and commitment to daily physical activity for a minimum of 30 minutes discussed and encouraged, as a part of hypertension management. The importance of attaining a healthy weight is also discussed.  

## 2012-11-16 NOTE — Assessment & Plan Note (Signed)
Controlled, no change in medication  

## 2012-11-16 NOTE — Assessment & Plan Note (Signed)
Hyperlipidemia:Low fat diet discussed and encouraged.  Updated lab next visit 

## 2012-11-16 NOTE — Assessment & Plan Note (Signed)
2 week antibiotic course prescribed

## 2012-11-18 ENCOUNTER — Ambulatory Visit: Payer: BC Managed Care – PPO | Admitting: Family Medicine

## 2012-12-05 ENCOUNTER — Telehealth: Payer: Self-pay | Admitting: Family Medicine

## 2012-12-05 MED ORDER — ALBUTEROL SULFATE HFA 108 (90 BASE) MCG/ACT IN AERS: 2.0000 | INHALATION_SPRAY | Freq: Four times a day (QID) | RESPIRATORY_TRACT | Status: DC | PRN

## 2012-12-05 MED ORDER — METHYLPREDNISOLONE 4 MG PO KIT: PACK | ORAL | Status: DC

## 2012-12-05 MED ORDER — LEVOFLOXACIN 500 MG PO TABS: 500.0000 mg | ORAL_TABLET | Freq: Every day | ORAL | Status: AC

## 2012-12-05 NOTE — Telephone Encounter (Signed)
Pt called in for herself and I spoke with partner She was seen in December for chronic sinusitis given bactrim, later given levaquin, symptoms have return , but also with cough, fever, wheeze, partner also sick, she is out of town, and needs meds called in. Has been taking OTC dayquil mucinex, cough now has some production Will send albuterol, medrol dosepak, levaquin Advised ER if no improvement

## 2012-12-12 ENCOUNTER — Other Ambulatory Visit: Payer: Self-pay | Admitting: Family Medicine

## 2013-01-13 ENCOUNTER — Other Ambulatory Visit: Payer: Self-pay | Admitting: Family Medicine

## 2013-02-11 ENCOUNTER — Other Ambulatory Visit: Payer: Self-pay | Admitting: Family Medicine

## 2013-03-05 ENCOUNTER — Other Ambulatory Visit: Payer: Self-pay | Admitting: Family Medicine

## 2013-03-13 ENCOUNTER — Other Ambulatory Visit: Payer: Self-pay | Admitting: Family Medicine

## 2013-04-14 ENCOUNTER — Other Ambulatory Visit: Payer: Self-pay | Admitting: Family Medicine

## 2013-04-19 ENCOUNTER — Other Ambulatory Visit: Payer: Self-pay | Admitting: Family Medicine

## 2013-05-13 ENCOUNTER — Other Ambulatory Visit: Payer: Self-pay | Admitting: Family Medicine

## 2013-05-21 ENCOUNTER — Other Ambulatory Visit: Payer: Self-pay | Admitting: Family Medicine

## 2013-05-22 LAB — CBC WITH DIFFERENTIAL/PLATELET
Eosinophils Relative: 3 % (ref 0–5)
HCT: 40.2 % (ref 36.0–46.0)
Hemoglobin: 13.5 g/dL (ref 12.0–15.0)
Lymphocytes Relative: 36 % (ref 12–46)
Lymphs Abs: 3.7 10*3/uL (ref 0.7–4.0)
MCV: 90.3 fL (ref 78.0–100.0)
Monocytes Absolute: 0.8 10*3/uL (ref 0.1–1.0)
Monocytes Relative: 8 % (ref 3–12)
RBC: 4.45 MIL/uL (ref 3.87–5.11)
WBC: 10 10*3/uL (ref 4.0–10.5)

## 2013-05-22 LAB — LIPID PANEL
Cholesterol: 313 mg/dL — ABNORMAL HIGH (ref 0–200)
Total CHOL/HDL Ratio: 6.8 Ratio

## 2013-05-22 LAB — COMPREHENSIVE METABOLIC PANEL
ALT: 18 U/L (ref 0–35)
BUN: 25 mg/dL — ABNORMAL HIGH (ref 6–23)
CO2: 26 mEq/L (ref 19–32)
Calcium: 9.7 mg/dL (ref 8.4–10.5)
Chloride: 102 mEq/L (ref 96–112)
Creat: 1.07 mg/dL (ref 0.50–1.10)
Glucose, Bld: 122 mg/dL — ABNORMAL HIGH (ref 70–99)

## 2013-05-22 LAB — VITAMIN D 25 HYDROXY (VIT D DEFICIENCY, FRACTURES): Vit D, 25-Hydroxy: 54 ng/mL (ref 30–89)

## 2013-05-22 LAB — HEMOGLOBIN A1C: Hgb A1c MFr Bld: 6.1 % — ABNORMAL HIGH (ref <5.7)

## 2013-05-25 ENCOUNTER — Ambulatory Visit (INDEPENDENT_AMBULATORY_CARE_PROVIDER_SITE_OTHER): Payer: BC Managed Care – PPO | Admitting: Family Medicine

## 2013-05-25 ENCOUNTER — Encounter: Payer: Self-pay | Admitting: Family Medicine

## 2013-05-25 VITALS — BP 118/82 | HR 68 | Resp 16 | Ht 66.0 in | Wt 219.0 lb

## 2013-05-25 DIAGNOSIS — E669 Obesity, unspecified: Secondary | ICD-10-CM

## 2013-05-25 DIAGNOSIS — R7309 Other abnormal glucose: Secondary | ICD-10-CM

## 2013-05-25 DIAGNOSIS — E785 Hyperlipidemia, unspecified: Secondary | ICD-10-CM

## 2013-05-25 DIAGNOSIS — R7303 Prediabetes: Secondary | ICD-10-CM

## 2013-05-25 DIAGNOSIS — Z2911 Encounter for prophylactic immunotherapy for respiratory syncytial virus (RSV): Secondary | ICD-10-CM

## 2013-05-25 DIAGNOSIS — F329 Major depressive disorder, single episode, unspecified: Secondary | ICD-10-CM

## 2013-05-25 DIAGNOSIS — I10 Essential (primary) hypertension: Secondary | ICD-10-CM

## 2013-05-25 DIAGNOSIS — G4700 Insomnia, unspecified: Secondary | ICD-10-CM

## 2013-05-25 MED ORDER — BUPROPION HCL ER (XL) 300 MG PO TB24: 300.0000 mg | ORAL_TABLET | ORAL | Status: DC

## 2013-05-25 MED ORDER — PRAVASTATIN SODIUM 20 MG PO TABS: 20.0000 mg | ORAL_TABLET | Freq: Every evening | ORAL | Status: DC

## 2013-05-25 MED ORDER — CLOTRIMAZOLE-BETAMETHASONE 1-0.05 % EX CREA: TOPICAL_CREAM | Freq: Two times a day (BID) | CUTANEOUS | Status: DC

## 2013-05-25 NOTE — Patient Instructions (Addendum)
F/u in mid November, call if you need me before  Increased dose of welbutrin to 300mg  daily, as discussed and stop fluoxetine  Fasting lipid, cmp and EGFR and hBa1C  In November   Vit D , EGFR, LDL will be added to lab recently drawn  PLEASE schedule mammogram, this is past due  Zostavax today, per your request  Congrats on blood pressure and HBA1C  Weight loss goal of 8 to 10 pounds in next 4 month  Schedule pap please

## 2013-05-27 LAB — CREATININE WITH EST GFR: Creat: 1.14 mg/dL — ABNORMAL HIGH (ref 0.50–1.10)

## 2013-05-27 LAB — LDL CHOLESTEROL, DIRECT: Direct LDL: 201 mg/dL — ABNORMAL HIGH

## 2013-05-30 NOTE — Assessment & Plan Note (Signed)
Improved, currently busy re modelling beach house trying to make it habitable

## 2013-05-30 NOTE — Assessment & Plan Note (Signed)
Deteriorated, pt take lipid lowering agent and work hard at low fat diet. Has been over indulging in cheese and fried foods

## 2013-05-30 NOTE — Assessment & Plan Note (Signed)
Deteriorated. Patient re-educated about  the importance of commitment to a  minimum of 150 minutes of exercise per week. The importance of healthy food choices with portion control discussed. Encouraged to start a food diary, count calories and to consider  joining a support group. Sample diet sheets offered. Goals set by the patient for the next several months.    

## 2013-05-30 NOTE — Assessment & Plan Note (Signed)
Controlled, no change in medication DASH diet and commitment to daily physical activity for a minimum of 30 minutes discussed and encouraged, as a part of hypertension management. The importance of attaining a healthy weight is also discussed.  

## 2013-05-30 NOTE — Assessment & Plan Note (Signed)
Improved with dietary change as awareness of impportance of this grows. Patient educated about the importance of limiting  Carbohydrate intake , the need to commit to daily physical activity for a minimum of 30 minutes , and to commit weight loss. The fact that changes in all these areas will reduce or eliminate all together the development of diabetes is stressed.

## 2013-05-30 NOTE — Progress Notes (Signed)
  Subjective:    Patient ID: Jennifer [name], female    DOB: 1956-02-18, 57 y.o.   MRN: [id number]  HPI The PT is here for follow up and re-evaluation of chronic medical conditions, medication management and review of any available recent lab and radiology data.  Preventive health is updated, specifically  Cancer screening and Immunization. Mammogram and pap are past due The PT states that she had discontinued statin, felt it made her ache however , wants to try another statin based on markedly elevatedcholesterol Has been "busy" remodeling a beach property, poor eating ha bits high in fat There are no specific new complaints  Requests zostavax, regardless of cost, states since [place] to be given at any age over 69, due to past severe singles , wants the vaccine      Review of Systems See HPI Denies recent fever or chills. Denies sinus pressure, nasal congestion, ear pain or sore throat. Denies chest congestion, productive cough or wheezing. Denies chest pains, palpitations and leg swelling Denies abdominal pain, nausea, vomiting,diarrhea or constipation.   Denies dysuria, frequency, hesitancy or incontinence. Denies joint pain, swelling and limitation in mobility. Denies headaches, seizures, numbness, or tingling. Denies uncontrolled  depression, anxiety or insomnia. Denies skin break down or rash.        Objective:   Physical Exam  Patient alert and oriented and in no cardiopulmonary distress.  HEENT: No facial asymmetry, EOMI, no sinus tenderness,  oropharynx pink and moist.  Neck supple no adenopathy.  Chest: Clear to auscultation bilaterally.  CVS: S1, S2 no murmurs, no S3.  ABD: Soft non tender. Bowel sounds normal.  Ext: No edema  MS: Adequate ROM spine, shoulders, hips and knees.  Skin: Intact, no ulcerations or rash noted.  Psych: Good eye contact, normal affect. Memory intact not anxious or depressed appearing.  CNS: CN 2-12 intact, power, tone and sensation  normal throughout.       Assessment & Plan:

## 2013-05-30 NOTE — Assessment & Plan Note (Signed)
Unchanged, continue restoril. Sleep hygiene reviewed

## 2013-05-31 ENCOUNTER — Ambulatory Visit: Payer: BC Managed Care – PPO | Admitting: Family Medicine

## 2013-06-10 ENCOUNTER — Other Ambulatory Visit: Payer: Self-pay | Admitting: Family Medicine

## 2013-06-10 ENCOUNTER — Telehealth: Payer: Self-pay | Admitting: Family Medicine

## 2013-06-10 DIAGNOSIS — I10 Essential (primary) hypertension: Secondary | ICD-10-CM

## 2013-06-10 MED ORDER — BUPROPION HCL ER (XL) 300 MG PO TB24: 300.0000 mg | ORAL_TABLET | ORAL | Status: DC

## 2013-06-10 NOTE — Telephone Encounter (Signed)
This was sent in but I resent it in again

## 2013-09-03 ENCOUNTER — Other Ambulatory Visit: Payer: Self-pay

## 2013-09-03 DIAGNOSIS — E785 Hyperlipidemia, unspecified: Secondary | ICD-10-CM

## 2013-09-03 DIAGNOSIS — I10 Essential (primary) hypertension: Secondary | ICD-10-CM

## 2013-09-03 MED ORDER — CLOTRIMAZOLE-BETAMETHASONE 1-0.05 % EX CREA: TOPICAL_CREAM | Freq: Two times a day (BID) | CUTANEOUS | Status: DC

## 2013-09-03 MED ORDER — OLMESARTAN MEDOXOMIL 40 MG PO TABS: ORAL_TABLET | ORAL | Status: DC

## 2013-09-03 MED ORDER — FLUOXETINE HCL 20 MG PO CAPS: ORAL_CAPSULE | ORAL | Status: DC

## 2013-09-03 MED ORDER — ATENOLOL-CHLORTHALIDONE 50-25 MG PO TABS: ORAL_TABLET | ORAL | Status: DC

## 2013-09-03 MED ORDER — PRAVASTATIN SODIUM 20 MG PO TABS: 20.0000 mg | ORAL_TABLET | Freq: Every evening | ORAL | Status: DC

## 2013-09-03 MED ORDER — TEMAZEPAM 15 MG PO CAPS: ORAL_CAPSULE | ORAL | Status: DC

## 2013-09-03 MED ORDER — BUPROPION HCL ER (XL) 300 MG PO TB24: 300.0000 mg | ORAL_TABLET | ORAL | Status: DC

## 2013-09-16 ENCOUNTER — Other Ambulatory Visit: Payer: Self-pay

## 2013-10-01 ENCOUNTER — Telehealth: Payer: Self-pay | Admitting: Family Medicine

## 2013-10-01 NOTE — Telephone Encounter (Signed)
I have not seen this paperwork come over. Carlisle Beers states it was put in your box

## 2013-10-03 ENCOUNTER — Telehealth: Payer: Self-pay | Admitting: Family Medicine

## 2013-10-03 NOTE — Telephone Encounter (Signed)
Pls contact Dymphna let her know form is completed and send it to her by fax, thanks

## 2013-10-04 IMAGING — CR DG OS CALCIS 2+V*R*
2 series · 2 of 2 positions shown · non-contrast
Comparison: None.

CLINICAL DATA: Postop retrocalcaneal exostectomy.

RIGHT OS CALCIS - 2+ VIEW  09/12/2011:

[view not recorded (1 of 2)]
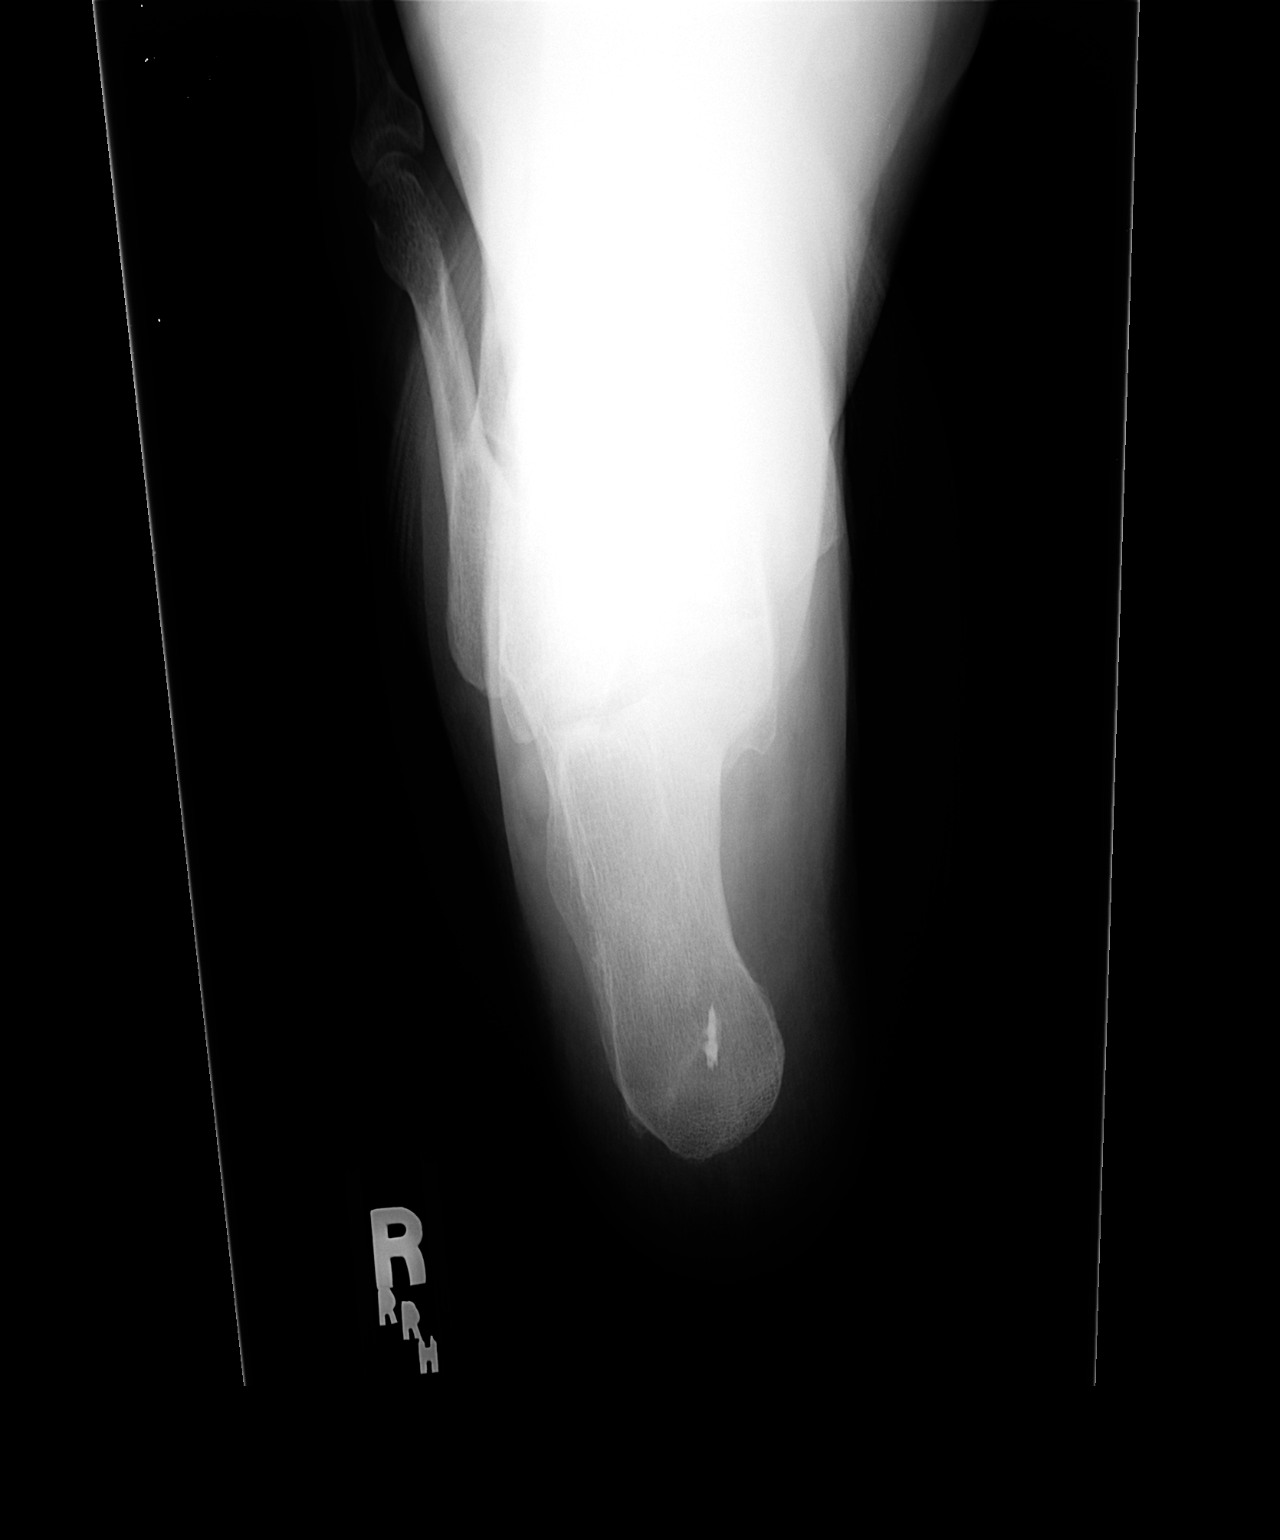

[view not recorded (2 of 2)]
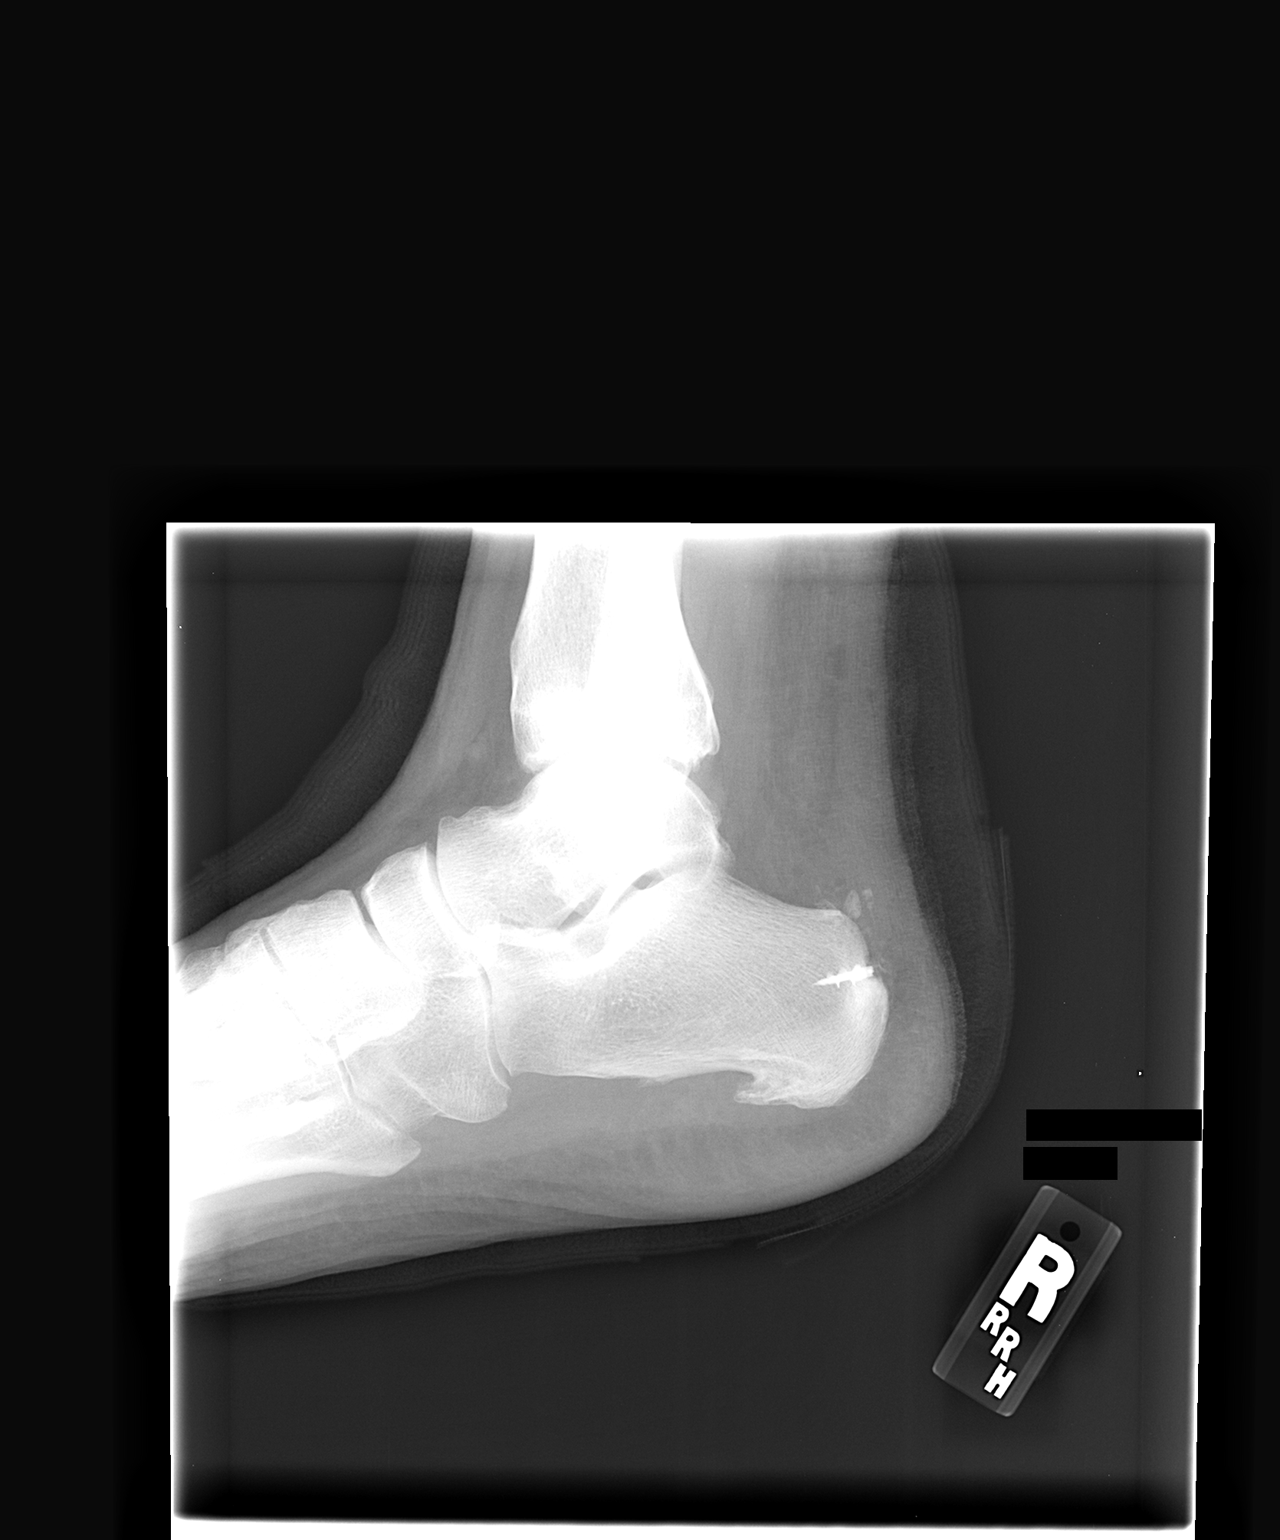

[2 of 2 positions shown; findings below may reference images not displayed]

FINDINGS: Post-surgical changes involving the posterior calcaneus.
Small amount of dystrophic calcification likely in the distal
Achilles tendon.  Moderately large plantar calcaneal spur.
IMPRESSION: Post-surgical changes.  Likely dystrophic calcification in the
distal Achilles tendon.  Moderately large plantar calcaneal spur.

## 2013-10-05 ENCOUNTER — Ambulatory Visit: Payer: BC Managed Care – PPO | Admitting: Family Medicine

## 2013-11-08 ENCOUNTER — Encounter: Payer: Self-pay | Admitting: Family Medicine

## 2013-11-12 ENCOUNTER — Other Ambulatory Visit: Payer: Self-pay | Admitting: Family Medicine

## 2013-11-12 ENCOUNTER — Ambulatory Visit: Payer: BC Managed Care – PPO | Admitting: Family Medicine

## 2013-11-19 ENCOUNTER — Ambulatory Visit: Payer: BC Managed Care – PPO | Admitting: Family Medicine

## 2013-12-13 ENCOUNTER — Encounter: Payer: Self-pay | Admitting: Family Medicine

## 2013-12-13 ENCOUNTER — Other Ambulatory Visit: Payer: Self-pay | Admitting: Family Medicine

## 2013-12-13 ENCOUNTER — Ambulatory Visit (INDEPENDENT_AMBULATORY_CARE_PROVIDER_SITE_OTHER): Payer: BC Managed Care – PPO | Admitting: Family Medicine

## 2013-12-13 VITALS — BP 160/100 | HR 95 | Resp 18 | Ht 66.0 in | Wt 215.1 lb

## 2013-12-13 DIAGNOSIS — E1129 Type 2 diabetes mellitus with other diabetic kidney complication: Secondary | ICD-10-CM

## 2013-12-13 DIAGNOSIS — R7303 Prediabetes: Secondary | ICD-10-CM

## 2013-12-13 DIAGNOSIS — E1121 Type 2 diabetes mellitus with diabetic nephropathy: Secondary | ICD-10-CM

## 2013-12-13 DIAGNOSIS — R7309 Other abnormal glucose: Secondary | ICD-10-CM

## 2013-12-13 DIAGNOSIS — G4700 Insomnia, unspecified: Secondary | ICD-10-CM

## 2013-12-13 DIAGNOSIS — F329 Major depressive disorder, single episode, unspecified: Secondary | ICD-10-CM

## 2013-12-13 DIAGNOSIS — I10 Essential (primary) hypertension: Secondary | ICD-10-CM

## 2013-12-13 DIAGNOSIS — F3289 Other specified depressive episodes: Secondary | ICD-10-CM

## 2013-12-13 DIAGNOSIS — E785 Hyperlipidemia, unspecified: Secondary | ICD-10-CM

## 2013-12-13 DIAGNOSIS — N058 Unspecified nephritic syndrome with other morphologic changes: Secondary | ICD-10-CM

## 2013-12-13 DIAGNOSIS — E669 Obesity, unspecified: Secondary | ICD-10-CM

## 2013-12-13 MED ORDER — PITAVASTATIN CALCIUM 2 MG PO TABS: 1.0000 | ORAL_TABLET | Freq: Every day | ORAL | Status: DC

## 2013-12-13 MED ORDER — LORCASERIN HCL 10 MG PO TABS: 1.0000 | ORAL_TABLET | Freq: Two times a day (BID) | ORAL | Status: DC

## 2013-12-13 MED ORDER — ALPRAZOLAM 0.5 MG PO TABS: 0.5000 mg | ORAL_TABLET | Freq: Every evening | ORAL | Status: DC | PRN

## 2013-12-13 MED ORDER — TEMAZEPAM 30 MG PO CAPS: 30.0000 mg | ORAL_CAPSULE | Freq: Every evening | ORAL | Status: DC | PRN

## 2013-12-13 NOTE — Patient Instructions (Addendum)
F/u in  4 month, call if you need me before  It is important that you exercise regularly at least 30 minutes 5 times a week. If you develop chest pain, have severe difficulty breathing, or feel very tired, stop exercising immediately and seek medical attention   A healthy diet is rich in fruit, vegetables and whole grains. Poultry fish, nuts and beans are a healthy choice for protein rather then red meat. A low sodium diet and drinking 64 ounces of water daily is generally recommended. Oils and sweet should be limited. Carbohydrates especially for those who are diabetic or overweight, should be limited to 34-45 gram per meal. It is important to eat on a regular schedule, at least 3 times daily. Snacks should be primarily fruits, vegetables or nuts.   New script and coupon for a medication for cholesterol, livalo,  is provided in case you need this. This has less s/e of muscle pain and you will get a coupon with 1 month free to try. Will send a msg based on lab results   New additional med to help with sleep is xanax  At bedtime, one, sleep is VITAL for good health  PLEASE get your mammogram and have the  Report sent to me, also when cycling PLEASE wear a helmet  All the best for 2015!

## 2013-12-14 ENCOUNTER — Encounter: Payer: Self-pay | Admitting: Family Medicine

## 2013-12-14 LAB — COMPLETE METABOLIC PANEL WITH GFR
ALBUMIN: 4.1 g/dL (ref 3.5–5.2)
ALT: 23 U/L (ref 0–35)
AST: 22 U/L (ref 0–37)
Alkaline Phosphatase: 74 U/L (ref 39–117)
BUN: 20 mg/dL (ref 6–23)
CO2: 26 mEq/L (ref 19–32)
CREATININE: 1.01 mg/dL (ref 0.50–1.10)
Calcium: 9.2 mg/dL (ref 8.4–10.5)
Chloride: 105 mEq/L (ref 96–112)
GFR, Est African American: 71 mL/min
GFR, Est Non African American: 62 mL/min
Glucose, Bld: 89 mg/dL (ref 70–99)
Potassium: 4.3 mEq/L (ref 3.5–5.3)
Sodium: 140 mEq/L (ref 135–145)
Total Bilirubin: 0.4 mg/dL (ref 0.2–1.2)
Total Protein: 7.1 g/dL (ref 6.0–8.3)

## 2013-12-14 LAB — LIPID PANEL
CHOLESTEROL: 301 mg/dL — AB (ref 0–200)
HDL: 52 mg/dL (ref >39)
LDL Cholesterol: 200 mg/dL — ABNORMAL HIGH (ref 0–99)
TRIGLYCERIDES: 243 mg/dL — AB (ref <150)
Total CHOL/HDL Ratio: 5.8 Ratio
VLDL: 49 mg/dL — AB (ref 0–40)

## 2013-12-14 LAB — HEMOGLOBIN A1C
Hgb A1c MFr Bld: 6 % — ABNORMAL HIGH (ref <5.7)
Mean Plasma Glucose: 126 mg/dL — ABNORMAL HIGH (ref <117)

## 2013-12-15 ENCOUNTER — Other Ambulatory Visit: Payer: Self-pay | Admitting: Family Medicine

## 2013-12-16 ENCOUNTER — Telehealth: Payer: Self-pay

## 2013-12-16 ENCOUNTER — Other Ambulatory Visit: Payer: Self-pay | Admitting: Family Medicine

## 2013-12-16 NOTE — Telephone Encounter (Signed)
She has myopathies , feels bad on previous statin,pravachol pls atempt PA Has high CV risk , glucose intolerance , severe hyper lipidemia, metabolic syndrome

## 2013-12-16 NOTE — Telephone Encounter (Signed)
Change  to crestor 5mg  one daily #30 refill 5 please , send in and let pt know that she is on the lOWEST dose of an excellent statin, I encourage her to give it a fair try

## 2013-12-16 NOTE — Telephone Encounter (Signed)
Livalo 2mg  needs a PA. Not covered by insurance. Preferred drugs are generic statin, crestor, simcor or vytorin. Please advise

## 2013-12-16 NOTE — Telephone Encounter (Signed)
They are saying that the med isn't denied for needing a PA that it isn't covered at all by the plan. She said there were other drugs other than pravastatin like crestor or vytorin. If you prefer none of these drugs then you must write a letter of medical neccessity and make it out to the "Claims Department" and fax to [phone]

## 2013-12-17 ENCOUNTER — Other Ambulatory Visit: Payer: Self-pay

## 2013-12-17 MED ORDER — ROSUVASTATIN CALCIUM 5 MG PO TABS: 5.0000 mg | ORAL_TABLET | Freq: Every day | ORAL | Status: DC

## 2013-12-17 NOTE — Telephone Encounter (Signed)
Called patient and left message for them to return call at the office   

## 2013-12-17 NOTE — Telephone Encounter (Signed)
Patient asked for a refill of her test trips (one touch ultra) but she is only a prediabetic and takes nothing by mouth. Should she still be testing? Please advise  Pt is fine with not testing she just wanted to follow your instruction.

## 2013-12-17 NOTE — Telephone Encounter (Signed)
She does not need to test. (ONCE her HBA1C was 6.5) so she is prediabetic

## 2013-12-17 NOTE — Telephone Encounter (Signed)
Left message for patient

## 2014-01-01 ENCOUNTER — Telehealth: Payer: Self-pay | Admitting: Family Medicine

## 2014-01-01 DIAGNOSIS — E1121 Type 2 diabetes mellitus with diabetic nephropathy: Secondary | ICD-10-CM

## 2014-01-01 DIAGNOSIS — I10 Essential (primary) hypertension: Secondary | ICD-10-CM

## 2014-01-01 DIAGNOSIS — R5381 Other malaise: Secondary | ICD-10-CM

## 2014-01-01 DIAGNOSIS — E785 Hyperlipidemia, unspecified: Secondary | ICD-10-CM

## 2014-01-01 DIAGNOSIS — R5383 Other fatigue: Secondary | ICD-10-CM

## 2014-01-01 NOTE — Assessment & Plan Note (Signed)
Deteriorated Hyperlipidemia:Low fat diet discussed and encouraged.  Will try low dose of statin

## 2014-01-01 NOTE — Assessment & Plan Note (Signed)
Improved HBA1C Pt encouraged to use metformin but does not want to do this Patient educated about the importance of limiting  Carbohydrate intake , the need to commit to daily physical activity for a minimum of 30 minutes , and to commit weight loss.

## 2014-01-01 NOTE — Telephone Encounter (Signed)
pls contact pt for  End June follow up, she should get labs before visit fasting if possible, if not , nbe prepared to have them the day of the visit. Pls remind her that based on labs and last visit,I recommended a sooner than 6 month f/u which I had originally mentioned, and she actually has no f/u appt in the system If she has no lab shhe she needs to let you know  So order can be mailed to her
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
## 2014-01-01 NOTE — Assessment & Plan Note (Addendum)
Controlled, no change in medication. Stressed over being unable to adopt a child , happy in new home with her partner to whom she is now legally married

## 2014-01-01 NOTE — Assessment & Plan Note (Signed)
Deteriorated. Patient re-educated about  the importance of commitment to a  minimum of 150 minutes of exercise per week. The importance of healthy food choices with portion control discussed. Encouraged to start a food diary, count calories and to consider  joining a support group. Sample diet sheets offered. Goals set by the patient for the next several months.    

## 2014-01-01 NOTE — Assessment & Plan Note (Signed)
Uncontrolled, states she did not take med this am due to diuretic s/e  DASH diet and commitment to daily physical activity for a minimum of 30 minutes discussed and encouraged, as a part of hypertension management. The importance of attaining a healthy weight is also discussed.

## 2014-01-03 NOTE — Addendum Note (Signed)
Addended by: Eual Fines on: 01/03/2014 02:47 PM   Modules accepted: Orders

## 2014-02-17 ENCOUNTER — Other Ambulatory Visit: Payer: Self-pay | Admitting: Family Medicine

## 2014-02-17 ENCOUNTER — Other Ambulatory Visit: Payer: Self-pay

## 2014-02-18 ENCOUNTER — Other Ambulatory Visit: Payer: Self-pay

## 2014-02-18 ENCOUNTER — Encounter: Payer: Self-pay | Admitting: Family Medicine

## 2014-02-18 DIAGNOSIS — E119 Type 2 diabetes mellitus without complications: Secondary | ICD-10-CM

## 2014-02-18 MED ORDER — GLUCOSE BLOOD VI STRP: ORAL_STRIP | Status: AC

## 2014-04-11 ENCOUNTER — Ambulatory Visit: Payer: BC Managed Care – PPO | Admitting: Family Medicine

## 2014-04-27 ENCOUNTER — Other Ambulatory Visit: Payer: Self-pay | Admitting: Family Medicine

## 2014-05-04 ENCOUNTER — Encounter: Payer: Self-pay | Admitting: Family Medicine

## 2014-05-27 ENCOUNTER — Ambulatory Visit: Payer: BC Managed Care – PPO | Admitting: Family Medicine

## 2014-06-20 ENCOUNTER — Encounter: Payer: Self-pay | Admitting: Family Medicine

## 2014-06-20 ENCOUNTER — Ambulatory Visit (INDEPENDENT_AMBULATORY_CARE_PROVIDER_SITE_OTHER): Payer: BC Managed Care – PPO | Admitting: Family Medicine

## 2014-06-20 VITALS — BP 160/90 | HR 80 | Resp 18 | Ht 66.0 in | Wt 222.0 lb

## 2014-06-20 DIAGNOSIS — I10 Essential (primary) hypertension: Secondary | ICD-10-CM

## 2014-06-20 DIAGNOSIS — G4700 Insomnia, unspecified: Secondary | ICD-10-CM

## 2014-06-20 DIAGNOSIS — E669 Obesity, unspecified: Secondary | ICD-10-CM

## 2014-06-20 DIAGNOSIS — R7309 Other abnormal glucose: Secondary | ICD-10-CM

## 2014-06-20 DIAGNOSIS — E1121 Type 2 diabetes mellitus with diabetic nephropathy: Secondary | ICD-10-CM

## 2014-06-20 DIAGNOSIS — F3289 Other specified depressive episodes: Secondary | ICD-10-CM

## 2014-06-20 DIAGNOSIS — N058 Unspecified nephritic syndrome with other morphologic changes: Secondary | ICD-10-CM

## 2014-06-20 DIAGNOSIS — E1129 Type 2 diabetes mellitus with other diabetic kidney complication: Secondary | ICD-10-CM

## 2014-06-20 DIAGNOSIS — R7303 Prediabetes: Secondary | ICD-10-CM

## 2014-06-20 DIAGNOSIS — Z23 Encounter for immunization: Secondary | ICD-10-CM | POA: Insufficient documentation

## 2014-06-20 DIAGNOSIS — F329 Major depressive disorder, single episode, unspecified: Secondary | ICD-10-CM

## 2014-06-20 DIAGNOSIS — E785 Hyperlipidemia, unspecified: Secondary | ICD-10-CM

## 2014-06-20 LAB — HEMOGLOBIN A1C
Hemoglobin-A1c: 6.4
LDL Cholesterol: 226 mg/dL

## 2014-06-20 MED ORDER — SUVOREXANT 20 MG PO TABS: 1.0000 | ORAL_TABLET | Freq: Every day | ORAL | Status: DC

## 2014-06-20 MED ORDER — EZETIMIBE 10 MG PO TABS: 10.0000 mg | ORAL_TABLET | Freq: Every day | ORAL | Status: DC

## 2014-06-20 NOTE — Assessment & Plan Note (Signed)
Uncontrolled, often misses diuretic, did not take today. Educated re importance of comliance, now has slight renal impaitment also DASH diet and commitment to daily physical activity for a minimum of 30 minutes discussed and encouraged, as a part of hypertension management. The importance of attaining a healthy weight is also discussed.
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
## 2014-06-20 NOTE — Assessment & Plan Note (Signed)
Deteriorated, but still OK to manage with diet only at this time, offer of metformin rejected currently Patient advised to reduce carb and sweets, commit to regular physical activity, take meds as prescribed, test blood as directed, and attempt to lose weight, to improve blood sugar control.

## 2014-06-20 NOTE — Patient Instructions (Addendum)
F/u in 4 month,call if you need me before  Prevnar today, and please get flu vaccine within the next 4 weeks if able  New medication for cholesterol is zetia 81m one daily  New medication  For sleep is  Belsorma, continue xanax as before, send a message if still a problem , I may need to increase the dose of bedtime xanax, stop restoril   Blood pressure is high and you have mild kidney impairment , very important to tak the 2 medications prescribed eVERY day, best to split when taking them as discussed  You are referred to [place]ariatric surgery in [place] I believe you will beenfit greatly  \Reduce sugar , carbs, chesses and fatt intake as much as possible and as consistently as possible  Foot exam today is good  Please schedule mammogram and eye exam , have reprots sent here also please  Fasting lipi, cmp and EGFR, HBA1C in 4 month

## 2014-06-21 NOTE — Assessment & Plan Note (Signed)
Deteriortaed, excess cheese intake also Hyperlipidemia:Low fat diet discussed and encouraged.  Start zetia

## 2014-06-21 NOTE — Assessment & Plan Note (Signed)
Controlled, no change in medication  

## 2014-06-21 NOTE — Assessment & Plan Note (Signed)
Administered at visit 

## 2014-06-21 NOTE — Assessment & Plan Note (Signed)
Deteriorated. Patient re-educated about  the importance of commitment to a  minimum of 150 minutes of exercise per week. The importance of healthy food choices with portion control discussed. Encouraged to start a food diary, count calories and to consider  joining a support group. Sample diet sheets offered. Goals set by the patient for the next several months.   Multiple co morbidities associated with increased Cv risk Despite multiple attempts at weight loss and lifestyle change , continues to gain weight. Good surgical candidate, highly motivated and now ready, referred for bariatric surgery, stated preference is for the sleeve

## 2014-06-21 NOTE — Assessment & Plan Note (Signed)
Uncontrolled , d/s restoril. Trial of new agent Sleep hygiene reviewed and written information offered also. Prescription sent for  medication needed.  May also need to increase dose of xanax, sh will be in touch to make me aware of response to current med Underlying night terror from childhood experiences from the age of 3 are a major issue

## 2014-06-22 ENCOUNTER — Encounter: Payer: Self-pay | Admitting: Family Medicine

## 2014-06-24 ENCOUNTER — Other Ambulatory Visit: Payer: Self-pay | Admitting: Family Medicine

## 2014-06-29 ENCOUNTER — Other Ambulatory Visit: Payer: Self-pay | Admitting: Family Medicine

## 2014-06-29 ENCOUNTER — Encounter: Payer: Self-pay | Admitting: Family Medicine

## 2014-06-29 ENCOUNTER — Telehealth: Payer: Self-pay | Admitting: Family Medicine

## 2014-06-29 NOTE — Telephone Encounter (Signed)
I have entered restoril 30mg  script and xanax 2mg  script historically, please print and fax these in the morning to pt's pharmaty , for sleep, review of her e mail will explain the situation , thanks

## 2014-06-30 ENCOUNTER — Other Ambulatory Visit: Payer: Self-pay

## 2014-06-30 MED ORDER — ALPRAZOLAM 2 MG PO TABS: 2.0000 mg | ORAL_TABLET | Freq: Every evening | ORAL | Status: DC | PRN

## 2014-06-30 MED ORDER — TEMAZEPAM 30 MG PO CAPS: 30.0000 mg | ORAL_CAPSULE | Freq: Every day | ORAL | Status: DC

## 2014-06-30 NOTE — Telephone Encounter (Signed)
meds printed to be signed then will fax

## 2014-07-15 ENCOUNTER — Encounter: Payer: Self-pay | Admitting: Family Medicine

## 2014-07-21 ENCOUNTER — Other Ambulatory Visit: Payer: Self-pay | Admitting: Family Medicine

## 2014-07-23 ENCOUNTER — Other Ambulatory Visit: Payer: Self-pay | Admitting: Family Medicine

## 2014-07-27 ENCOUNTER — Encounter: Payer: Self-pay | Admitting: Family Medicine

## 2014-07-28 ENCOUNTER — Encounter: Payer: Self-pay | Admitting: Family Medicine

## 2014-08-01 ENCOUNTER — Other Ambulatory Visit: Payer: Self-pay

## 2014-08-01 ENCOUNTER — Encounter: Payer: Self-pay | Admitting: Family Medicine

## 2014-09-12 ENCOUNTER — Other Ambulatory Visit: Payer: Self-pay | Admitting: Family Medicine

## 2014-11-17 ENCOUNTER — Other Ambulatory Visit: Payer: Self-pay | Admitting: Family Medicine

## 2014-11-30 ENCOUNTER — Encounter: Payer: Self-pay | Admitting: Family Medicine

## 2014-12-07 ENCOUNTER — Ambulatory Visit (INDEPENDENT_AMBULATORY_CARE_PROVIDER_SITE_OTHER): Payer: BLUE CROSS/BLUE SHIELD | Admitting: Family Medicine

## 2014-12-07 ENCOUNTER — Telehealth: Payer: Self-pay | Admitting: *Deleted

## 2014-12-07 ENCOUNTER — Encounter: Payer: Self-pay | Admitting: Family Medicine

## 2014-12-07 VITALS — BP 122/80 | HR 65 | Resp 16 | Ht 66.0 in | Wt 232.1 lb

## 2014-12-07 DIAGNOSIS — E8881 Metabolic syndrome: Secondary | ICD-10-CM

## 2014-12-07 DIAGNOSIS — F5105 Insomnia due to other mental disorder: Secondary | ICD-10-CM

## 2014-12-07 DIAGNOSIS — I10 Essential (primary) hypertension: Secondary | ICD-10-CM

## 2014-12-07 DIAGNOSIS — F418 Other specified anxiety disorders: Secondary | ICD-10-CM

## 2014-12-07 DIAGNOSIS — E785 Hyperlipidemia, unspecified: Secondary | ICD-10-CM

## 2014-12-07 DIAGNOSIS — G4700 Insomnia, unspecified: Secondary | ICD-10-CM

## 2014-12-07 DIAGNOSIS — E1121 Type 2 diabetes mellitus with diabetic nephropathy: Secondary | ICD-10-CM

## 2014-12-07 DIAGNOSIS — Z6835 Body mass index (BMI) 35.0-35.9, adult: Secondary | ICD-10-CM

## 2014-12-07 DIAGNOSIS — F329 Major depressive disorder, single episode, unspecified: Secondary | ICD-10-CM

## 2014-12-07 DIAGNOSIS — F409 Phobic anxiety disorder, unspecified: Secondary | ICD-10-CM

## 2014-12-07 DIAGNOSIS — F411 Generalized anxiety disorder: Secondary | ICD-10-CM

## 2014-12-07 DIAGNOSIS — F32A Depression, unspecified: Secondary | ICD-10-CM

## 2014-12-07 DIAGNOSIS — Z62819 Personal history of unspecified abuse in childhood: Secondary | ICD-10-CM

## 2014-12-07 NOTE — Telephone Encounter (Signed)
noted 

## 2014-12-07 NOTE — Telephone Encounter (Signed)
Jennifer [name] called wanting to let Dr. Moshe Cipro know that her and the patient are very grateful for her and Dr. Moshe Cipro has took such good care of Jennifer, [name] also stated pt was here this morning and they greatly appreciate everything that Dr. Moshe Cipro has done.

## 2014-12-07 NOTE — Patient Instructions (Signed)
F/u in 4 month, call if you need me before  You are being referred to therapist and psychiatrist in your area to deal with your mental health issues  I will directly contact the bariatric surgeon to see where you are as far as the bariatric surgeon  Pls change from  ice cream to fruit  And vegetable  So that your cholesterol improves
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
## 2014-12-11 DIAGNOSIS — E8881 Metabolic syndrome: Secondary | ICD-10-CM | POA: Insufficient documentation

## 2014-12-11 NOTE — Assessment & Plan Note (Signed)
Controlled, no change in medication DASH diet and commitment to daily physical activity for a minimum of 30 minutes discussed and encouraged, as a part of hypertension management. The importance of attaining a healthy weight is also discussed.  

## 2014-12-11 NOTE — Assessment & Plan Note (Signed)
Diet controlled only, recent HBA1C still under 7 in 11/2014, no medication prescribed

## 2014-12-11 NOTE — Assessment & Plan Note (Signed)
Deteriorated. Patient re-educated about  the importance of commitment to a  minimum of 150 minutes of exercise per week. The importance of healthy food choices with portion control discussed. Encouraged to start a food diary, count calories and to consider  joining a support group. Sample diet sheets offered. Goals set by the patient for the next several months.   Will speak directly with bariatric surgeon to determine status of patient's progress in terms of her surgery and also to share her current situation of severe mental decompensation following her recent attempt at sleep study
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
## 2014-12-11 NOTE — Assessment & Plan Note (Signed)
Deteriorated, npow only able to sleep in the day, due to increased flash backs following recently attempted sleep study Sleep hygiene reviewed and written information offered also. Prescription sent for  medication needed.No change in dose or med, needs therapy

## 2014-12-11 NOTE — Assessment & Plan Note (Signed)
Deterioration in lipds, intolerant of statins, continue zetia. Pt to improve diet, as her emotional and mental health improves , so will her diet, hopefully this will soon happen

## 2014-12-11 NOTE — Assessment & Plan Note (Signed)
The increased risk of cardiovascular disease associated with this diagnosis, and the need to consistently work on lifestyle to change this is discussed. Following  a  heart healthy diet ,commitment to 30 minutes of exercise at least 5 days per week, as well as control of blood sugar and cholesterol , and achieving a healthy weight are all the areas to be addressed .  

## 2014-12-12 ENCOUNTER — Telehealth: Payer: Self-pay | Admitting: Family Medicine

## 2014-12-12 NOTE — Telephone Encounter (Signed)
Spoke with the PA who works with the Doc , made her aware of the situation, as regards to sleep study failure and wanting to know where she stands with regard to her surgery, states she will convey info to him in the am

## 2014-12-14 NOTE — Telephone Encounter (Signed)
noted 

## 2014-12-15 ENCOUNTER — Ambulatory Visit: Payer: Self-pay | Admitting: Family Medicine

## 2014-12-20 ENCOUNTER — Telehealth: Payer: Self-pay | Admitting: Family Medicine

## 2014-12-20 NOTE — Telephone Encounter (Signed)
pls contact the surgeon's office again call the number see if i can spk with surgeon or the PA who i spoke to before for follow up on this, surgeion is Dr Collene Mares, I was able tospk with the PA several weeks ago who stated she would let surgeon knwo. I cam spk to either of them by 3pm today

## 2014-12-21 ENCOUNTER — Other Ambulatory Visit: Payer: Self-pay | Admitting: Family Medicine

## 2014-12-21 ENCOUNTER — Encounter: Payer: Self-pay | Admitting: Family Medicine

## 2014-12-21 NOTE — Telephone Encounter (Signed)
Dr Youlanda Mighty called back, I spoke directly with him, he stated that he is not a bariatric surgeon but cardiothoracic??,stated he had seen pt in the past Stated bariatric surgeon may be Dr Ouida Sills,.  I immediately contacted pt and made her aware that she needed to provifde scheduling staff with Pls may name and number of the Doc she woi;d like me to follow up with as regards her bariatric surgeryShe apologized and statedthat hse would before  The end of the day I  When you have correct Doc on line per pt , pls get me to the phone

## 2014-12-21 NOTE — Telephone Encounter (Signed)
Left message with Dr Youlanda Mighty office to call back at [phone] to speak with you

## 2014-12-22 ENCOUNTER — Telehealth: Payer: Self-pay | Admitting: Family Medicine

## 2014-12-26 ENCOUNTER — Telehealth: Payer: Self-pay | Admitting: Family Medicine

## 2014-12-26 NOTE — Telephone Encounter (Signed)
Spoke directly with the pt today, she has had a direct call from the bariatric surgeon, and she has appt to get fitted for the machine, sp that she  Can move forward with her surgery. and also has upcoming appt with a  therapist  Who she is anxious to see due to ongoing decompensation, now convinced  That she has PTSD , and is fully accepting of the need for therapy.

## 2014-12-29 ENCOUNTER — Telehealth: Payer: Self-pay | Admitting: Family Medicine

## 2014-12-29 NOTE — Telephone Encounter (Signed)
error 

## 2014-12-30 MED ORDER — FLUOXETINE HCL 20 MG PO CAPS: 60.0000 mg | ORAL_CAPSULE | Freq: Every day | ORAL | Status: DC

## 2014-12-30 NOTE — Telephone Encounter (Signed)
Noted- updated pt record

## 2015-01-09 ENCOUNTER — Encounter: Payer: Self-pay | Admitting: Family Medicine

## 2015-01-23 ENCOUNTER — Other Ambulatory Visit: Payer: Self-pay | Admitting: Family Medicine

## 2015-02-09 ENCOUNTER — Encounter: Payer: Self-pay | Admitting: Family Medicine

## 2015-02-21 ENCOUNTER — Other Ambulatory Visit: Payer: Self-pay | Admitting: Family Medicine

## 2015-03-24 ENCOUNTER — Other Ambulatory Visit: Payer: Self-pay | Admitting: Family Medicine

## 2015-03-30 ENCOUNTER — Telehealth: Payer: Self-pay | Admitting: Family Medicine

## 2015-03-30 NOTE — Telephone Encounter (Signed)
Will call when able

## 2015-03-31 NOTE — Telephone Encounter (Signed)
Called both numbers listed and left a message to call back

## 2015-03-31 NOTE — Telephone Encounter (Signed)
pls call and document concerns. As far as I can gather from the message  , if  Jennifer [name] not seeing a psychiatrist locally , she does need  One, I know she sees a therapis, but she is clearly having a very stressful  And difficult time, I am concerned and this is what I best recommend. In terms of "speeding up the surgery" patient and partner should  Meet with surgeon to discuss time frame and the expectations and see what if any are the delays. I as another physician am not able to "huirry the process" I am willing to send a note to the surgeon letting  Him know of concern rer delay, but they should see Doc top get situation sorted out, not will be written and sent if they agree to have some sort of conference with the surgeon also   PLS verify there is clear documentation to discuss health concerns prior to speaking with her partner.  ??/concerns pls ask

## 2015-04-04 ENCOUNTER — Ambulatory Visit: Payer: BLUE CROSS/BLUE SHIELD | Admitting: Family Medicine

## 2015-04-04 NOTE — Telephone Encounter (Signed)
Called patient and left message for them to return call at the office   

## 2015-04-05 NOTE — Telephone Encounter (Signed)
Her surgery has been hopefullyscheduled for next month. She was very happy about this and wanted to thank all of [place] for our support and caring for her but she is much better now.

## 2015-04-12 DIAGNOSIS — G510 Bell's palsy: Secondary | ICD-10-CM

## 2015-04-12 HISTORY — DX: Bell's palsy: G51.0

## 2015-04-13 ENCOUNTER — Encounter: Payer: Self-pay | Admitting: Family Medicine

## 2015-04-21 ENCOUNTER — Encounter: Payer: Self-pay | Admitting: Family Medicine

## 2015-04-21 ENCOUNTER — Other Ambulatory Visit: Payer: Self-pay | Admitting: Family Medicine

## 2015-04-24 ENCOUNTER — Ambulatory Visit: Payer: BLUE CROSS/BLUE SHIELD | Admitting: Family Medicine

## 2015-05-01 ENCOUNTER — Telehealth: Payer: Self-pay | Admitting: Family Medicine

## 2015-05-01 ENCOUNTER — Encounter: Payer: Self-pay | Admitting: Family Medicine

## 2015-05-02 NOTE — Telephone Encounter (Signed)
Information faxed on 6/20

## 2015-05-12 HISTORY — PX: BREAST BIOPSY: SHX20

## 2015-05-22 ENCOUNTER — Telehealth: Payer: Self-pay | Admitting: Family Medicine

## 2015-05-22 NOTE — Telephone Encounter (Signed)
Pls call pt , let her know HBa1C is up to 7.1, total chol 271, and bad cholesterol 168, DO NOT CALL, has appt this week, pls just add CMP and EGFR

## 2015-05-23 NOTE — Telephone Encounter (Signed)
Per lab it was too late to add on

## 2015-05-24 ENCOUNTER — Other Ambulatory Visit: Payer: Self-pay | Admitting: Family Medicine

## 2015-05-25 ENCOUNTER — Encounter: Payer: Self-pay | Admitting: Family Medicine

## 2015-05-25 ENCOUNTER — Ambulatory Visit (INDEPENDENT_AMBULATORY_CARE_PROVIDER_SITE_OTHER): Payer: BLUE CROSS/BLUE SHIELD | Admitting: Family Medicine

## 2015-05-25 VITALS — BP 114/74 | HR 78 | Resp 16 | Ht 66.0 in | Wt 235.0 lb

## 2015-05-25 DIAGNOSIS — G4700 Insomnia, unspecified: Secondary | ICD-10-CM

## 2015-05-25 DIAGNOSIS — E1121 Type 2 diabetes mellitus with diabetic nephropathy: Secondary | ICD-10-CM

## 2015-05-25 DIAGNOSIS — F418 Other specified anxiety disorders: Secondary | ICD-10-CM | POA: Diagnosis not present

## 2015-05-25 DIAGNOSIS — E785 Hyperlipidemia, unspecified: Secondary | ICD-10-CM | POA: Diagnosis not present

## 2015-05-25 DIAGNOSIS — I10 Essential (primary) hypertension: Secondary | ICD-10-CM

## 2015-05-25 DIAGNOSIS — E8881 Metabolic syndrome: Secondary | ICD-10-CM

## 2015-05-25 LAB — HEMOGLOBIN A1C
A1c: 7.1
LDL: 168

## 2015-05-25 MED ORDER — TRAZODONE HCL 100 MG PO TABS: 100.0000 mg | ORAL_TABLET | Freq: Every day | ORAL | Status: DC

## 2015-05-25 MED ORDER — BUSPIRONE HCL 5 MG PO TABS: 5.0000 mg | ORAL_TABLET | Freq: Three times a day (TID) | ORAL | Status: DC

## 2015-05-25 NOTE — Patient Instructions (Addendum)
F/u in 4 month, call if you need me before  You are referred to psychiatrist, call with name of Doc   New for anxiety is buspar every 8 hours    BP is excellent  New for sleep is trazodone  I am very thankful surgery  Is on th horizon  Please work on good  health habits so that your health will improve. 1. Commitment to daily physical activity for 30 to 60  minutes, if you are able to do this.  2. Commitment to wise food choices. Aim for half of your  food intake to be vegetable and fruit, one quarter starchy foods, and one quarter protein. Try to eat on a regular schedule  3 meals per day, snacking between meals should be limited to vegetables or fruits or small portions of nuts. 64 ounces of water per day is generally recommended, unless you have specific health conditions, like heart failure or kidney failure where you will need to limit fluid intake.  3. Commitment to sufficient and a  good quality of physical and mental rest daily, generally between 6 to 8 hours per day.  WITH PERSISTANCE AND PERSEVERANCE, THE IMPOSSIBLE , BECOMES THE NORM!   Thanks for choosing [facility], we consider it a privelige to serve you. `

## 2015-05-28 NOTE — Assessment & Plan Note (Signed)
Uncontrolled/ untreated, change med Sleep hygiene reviewed and written information offered also. Prescription sent for  medication needed.

## 2015-05-28 NOTE — Progress Notes (Signed)
Jennifer [name]     MRN: [id number]      DOB: 1956/05/13   HPI Jennifer [name] is here for follow up and re-evaluation of chronic medical conditions, medication management and review of any available recent lab and radiology data.  Preventive health is updated, specifically  Cancer screening and Immunization.   Questions or concerns regarding consultations or procedures which the PT has had in the interim are  addressed. The PT denies any adverse reactions to current medications since the last visit.  There are no new concerns.  There are no specific complaints   ROS Denies recent fever or chills. Denies sinus pressure, nasal congestion, ear pain or sore throat. Denies chest congestion, productive cough or wheezing. Denies chest pains, palpitations and leg swelling Denies abdominal pain, nausea, vomiting,diarrhea or constipation.   Denies dysuria, frequency, hesitancy or incontinence. Denies joint pain, swelling and limitation in mobility. Denies headaches, seizures, numbness, or tingling. C/o uncontrolled depression, anxiety and worsened insomnia. States that her mental health has significantly deteriorated since  Her sleep study, keeps having flashbacks of her childhood when she experienced repeated incest. States she is now diagnosed with PTSD, has been unable to get the psychiatric help she needs , c/o excessive anxiety, significant mood instability from total withdrawal, to unexplainable crying spells, has had suicidal thoughts, and asks her partner to administer/ keep her medications from time to time. Wants a psychiatrist, will see the psychiatrist that her partner sees locally, in the interim requests help with anxiety and sleep Denies skin break down or rash. Excited about upcoming bariatric surgery which has finally been approved, but very concerned about her mental heath. No active suicidal or homicidal thoughts currently States an attempt was made to increase her fluoxetine with bad  s/e   PE  BP 114/74 mmHg  Pulse 78  Resp 16  Ht 5\' 6"  (1.676 m)  Wt 235 lb (106.595 kg)  BMI 37.95 kg/m2  SpO2 96%  Patient alert and oriented and in no cardiopulmonary distress.  HEENT: No facial asymmetry, EOMI,   oropharynx pink and moist.  Neck supple no JVD, no mass.  Chest: Clear to auscultation bilaterally.  CVS: S1, S2 no murmurs, no S3.Regular rate.  ABD: Soft non tender.   Ext: No edema  MS: Adequate ROM spine, shoulders, hips and knees.  Skin: Intact, no ulcerations or rash noted.  Psych: Good eye contact, normal affect. Memory intact anxious and  depressed appearing.Easily tearful  CNS: CN 2-12 intact, power,  normal throughout.no focal deficits noted.   Assessment & Plan   Essential hypertension Controlled, no change in medication DASH diet and commitment to daily physical activity for a minimum of 30 minutes discussed and encouraged, as a part of hypertension management. The importance of attaining a healthy weight is also discussed.  BP/Weight 05/25/2015 12/07/2014 06/20/2014 12/13/2013 05/25/2013 73/53/2992 02/10/6833  Systolic BP 196 222 979 892 119 417 408  Diastolic BP 74 80 90 144 82 70 55  Wt. (Lbs) 235 232.12 222 215.08 219 216.12 213  BMI 37.95 37.48 35.85 34.73 35.36 34.9 34.4        Diabetes mellitus with nephropathy Deteriorated Jennifer [name] is reminded of the importance of commitment to daily physical activity for 30 minutes or more, as able and the need to limit carbohydrate intake to 30 to 60 grams per meal to help with blood sugar control.   The need to take medication as prescribed, test blood sugar as directed, and to call between  visits if there is a concern that blood sugar is uncontrolled is also discussed.   Jennifer [name] is reminded of the importance of daily foot exam, annual eye examination, and good blood sugar, blood pressure and cholesterol control.  Diabetic Labs Latest Ref Rng 06/17/2014 12/13/2013 05/21/2013 05/21/2013 05/21/2012   HbA1c - 6.4 6.0(H) - 6.1(H) 6.2(H)  Microalbumin 0.00 - 1.89 mg/dL - - - - -  Micro/Creat Ratio 0.0 - 30.0 mg/g - - - - -  Chol 0 - 200 mg/dL - 301(H) - 313(H) 212(H)  HDL >39 mg/dL - 52 - 46 49  Calc LDL - 226 200(H) - 211(H) 114(H)  Triglycerides <150 mg/dL - 243(H) - 282(H) 247(H)  Creatinine 0.50 - 1.10 mg/dL - 1.01 1.14(H) 1.07 -   BP/Weight 05/25/2015 12/07/2014 06/20/2014 12/13/2013 05/25/2013 45/40/9811 07/12/4781  Systolic BP 956 213 086 578 469 629 528  Diastolic BP 74 80 90 413 82 70 55  Wt. (Lbs) 235 232.12 222 215.08 219 216.12 213  BMI 37.95 37.48 35.85 34.73 35.36 34.9 34.4   Foot/eye exam completion dates 06/20/2014  Foot Form Completion Done         Hyperlipidemia LDL goal <100 Deteriorated, and statin intolerant. Currently on zetia Hyperlipidemia:Low fat diet discussed and encouraged.   Lipid Panel  Lab Results  Component Value Date   CHOL 301* 12/13/2013   HDL 52 12/13/2013   LDLCALC 226 06/17/2014   LDLDIRECT 201* 05/21/2013   TRIG 243* 12/13/2013   CHOLHDL 5.8 12/13/2013   Updated lab is scanned in     Morbid obesity Deteriorated.Anticipates bariatric surgery to be done within the next month. Patient re-educated about  the importance of commitment to a  minimum of 150 minutes of exercise per week.  The importance of healthy food choices with portion control discussed. Encouraged to start a food diary, count calories and to consider  joining a support group. Sample diet sheets offered. Goals set by the patient for the next several months.   Weight /BMI 05/25/2015 12/07/2014 06/20/2014  WEIGHT 235 lb 232 lb 1.9 oz 222 lb  HEIGHT 5\' 6"  5\' 6"  5\' 6"   BMI 37.95 kg/m2 37.48 kg/m2 35.85 kg/m2    Current exercise per week 60 minutes.   Depression with anxiety Uncontrolled and worsening, needs psychiatry, unable to locate  Psychiatrist where she lives on the [place], but knows one which her partner sees , who is interested in seeing.  Start buspar for  anxiety, no change  In fluoxetine dose  Insomnia Uncontrolled/ untreated, change med Sleep hygiene reviewed and written information offered also. Prescription sent for  medication needed.   Metabolic syndrome X The increased risk of cardiovascular disease associated with this diagnosis, and the need to consistently work on lifestyle to change this is discussed. Following  a  heart healthy diet ,commitment to 30 minutes of exercise at least 5 days per week, as well as control of blood sugar and cholesterol , and achieving a healthy weight are all the areas to be addressed .

## 2015-05-28 NOTE — Assessment & Plan Note (Addendum)
Deteriorated.Anticipates bariatric surgery to be done within the next month. Patient re-educated about  the importance of commitment to a  minimum of 150 minutes of exercise per week.  The importance of healthy food choices with portion control discussed. Encouraged to start a food diary, count calories and to consider  joining a support group. Sample diet sheets offered. Goals set by the patient for the next several months.   Weight /BMI 05/25/2015 12/07/2014 06/20/2014  WEIGHT 235 lb 232 lb 1.9 oz 222 lb  HEIGHT 5\' 6"  5\' 6"  5\' 6"   BMI 37.95 kg/m2 37.48 kg/m2 35.85 kg/m2    Current exercise per week 60 minutes.

## 2015-05-28 NOTE — Assessment & Plan Note (Signed)
Uncontrolled and worsening, needs psychiatry, unable to locate  Psychiatrist where she lives on the [place], but knows one which her partner sees , who is interested in seeing.  Start buspar for anxiety, no change  In fluoxetine dose

## 2015-05-28 NOTE — Assessment & Plan Note (Signed)
Controlled, no change in medication DASH diet and commitment to daily physical activity for a minimum of 30 minutes discussed and encouraged, as a part of hypertension management. The importance of attaining a healthy weight is also discussed.  BP/Weight 05/25/2015 12/07/2014 06/20/2014 12/13/2013 05/25/2013 94/49/6759 11/16/3844  Systolic BP 659 935 701 779 390 300 923  Diastolic BP 74 80 90 300 82 70 55  Wt. (Lbs) 235 232.12 222 215.08 219 216.12 213  BMI 37.95 37.48 35.85 34.73 35.36 34.9 34.4

## 2015-05-28 NOTE — Assessment & Plan Note (Signed)
Deteriorated Jennifer [name] is reminded of the importance of commitment to daily physical activity for 30 minutes or more, as able and the need to limit carbohydrate intake to 30 to 60 grams per meal to help with blood sugar control.   The need to take medication as prescribed, test blood sugar as directed, and to call between visits if there is a concern that blood sugar is uncontrolled is also discussed.   Jennifer [name] is reminded of the importance of daily foot exam, annual eye examination, and good blood sugar, blood pressure and cholesterol control.  Diabetic Labs Latest Ref Rng 06/17/2014 12/13/2013 05/21/2013 05/21/2013 05/21/2012  HbA1c - 6.4 6.0(H) - 6.1(H) 6.2(H)  Microalbumin 0.00 - 1.89 mg/dL - - - - -  Micro/Creat Ratio 0.0 - 30.0 mg/g - - - - -  Chol 0 - 200 mg/dL - 301(H) - 313(H) 212(H)  HDL >39 mg/dL - 52 - 46 49  Calc LDL - 226 200(H) - 211(H) 114(H)  Triglycerides <150 mg/dL - 243(H) - 282(H) 247(H)  Creatinine 0.50 - 1.10 mg/dL - 1.01 1.14(H) 1.07 -   BP/Weight 05/25/2015 12/07/2014 06/20/2014 12/13/2013 05/25/2013 87/56/4332 07/17/1883  Systolic BP 166 063 016 010 932 355 732  Diastolic BP 74 80 90 202 82 70 55  Wt. (Lbs) 235 232.12 222 215.08 219 216.12 213  BMI 37.95 37.48 35.85 34.73 35.36 34.9 34.4   Foot/eye exam completion dates 06/20/2014  Foot Form Completion Done

## 2015-05-28 NOTE — Assessment & Plan Note (Signed)
The increased risk of cardiovascular disease associated with this diagnosis, and the need to consistently work on lifestyle to change this is discussed. Following  a  heart healthy diet ,commitment to 30 minutes of exercise at least 5 days per week, as well as control of blood sugar and cholesterol , and achieving a healthy weight are all the areas to be addressed .  

## 2015-05-28 NOTE — Assessment & Plan Note (Signed)
Deteriorated, and statin intolerant. Currently on zetia Hyperlipidemia:Low fat diet discussed and encouraged.   Lipid Panel  Lab Results  Component Value Date   CHOL 301* 12/13/2013   HDL 52 12/13/2013   LDLCALC 226 06/17/2014   LDLDIRECT 201* 05/21/2013   TRIG 243* 12/13/2013   CHOLHDL 5.8 12/13/2013   Updated lab is scanned in

## 2015-05-29 ENCOUNTER — Telehealth: Payer: Self-pay | Admitting: *Deleted

## 2015-05-29 NOTE — Telephone Encounter (Signed)
Patient called me back stating she will call back by the end of the day with the name of the doctor she wants to see, pt said she had a appt with the OBGYN they found a mass during her last pap.

## 2015-05-31 ENCOUNTER — Other Ambulatory Visit: Payer: Self-pay | Admitting: Radiology

## 2015-06-01 NOTE — Telephone Encounter (Signed)
Noted, thanks!

## 2015-06-01 NOTE — Telephone Encounter (Signed)
Received results on patient.   Results were from recent Mammogram.  Reviewed results with patient and she has already been made aware by radiology.  She has also already had biopsy done on 05/31/15.  She is in close contact with ordering physician and is grateful for care and concern.

## 2015-06-02 ENCOUNTER — Telehealth: Payer: Self-pay | Admitting: *Deleted

## 2015-06-02 ENCOUNTER — Encounter: Payer: Self-pay | Admitting: *Deleted

## 2015-06-02 DIAGNOSIS — Z170 Estrogen receptor positive status [ER+]: Secondary | ICD-10-CM | POA: Insufficient documentation

## 2015-06-02 DIAGNOSIS — C50412 Malignant neoplasm of upper-outer quadrant of left female breast: Secondary | ICD-10-CM

## 2015-06-02 HISTORY — DX: Malignant neoplasm of upper-outer quadrant of left female breast: C50.412

## 2015-06-02 NOTE — Telephone Encounter (Signed)
Confirmed [facility] for 06/07/15 at 1200 .  Instructions and contact information given.

## 2015-06-06 ENCOUNTER — Encounter: Payer: Self-pay | Admitting: Family Medicine

## 2015-06-07 ENCOUNTER — Ambulatory Visit (HOSPITAL_BASED_OUTPATIENT_CLINIC_OR_DEPARTMENT_OTHER): Payer: BLUE CROSS/BLUE SHIELD | Admitting: Oncology

## 2015-06-07 ENCOUNTER — Encounter: Payer: Self-pay | Admitting: Oncology

## 2015-06-07 ENCOUNTER — Encounter: Payer: Self-pay | Admitting: *Deleted

## 2015-06-07 ENCOUNTER — Ambulatory Visit: Payer: BLUE CROSS/BLUE SHIELD

## 2015-06-07 ENCOUNTER — Other Ambulatory Visit (HOSPITAL_BASED_OUTPATIENT_CLINIC_OR_DEPARTMENT_OTHER): Payer: BLUE CROSS/BLUE SHIELD

## 2015-06-07 ENCOUNTER — Other Ambulatory Visit: Payer: Self-pay | Admitting: General Surgery

## 2015-06-07 ENCOUNTER — Encounter: Payer: Self-pay | Admitting: Nurse Practitioner

## 2015-06-07 ENCOUNTER — Encounter: Payer: Self-pay | Admitting: Physical Therapy

## 2015-06-07 ENCOUNTER — Ambulatory Visit
Admission: RE | Admit: 2015-06-07 | Discharge: 2015-06-07 | Disposition: A | Discharge status: Home / Self Care | Payer: BLUE CROSS/BLUE SHIELD | Source: Ambulatory Visit | Attending: Radiation Oncology | Admitting: Radiation Oncology

## 2015-06-07 ENCOUNTER — Ambulatory Visit: Payer: BLUE CROSS/BLUE SHIELD | Attending: General Surgery | Admitting: Physical Therapy

## 2015-06-07 VITALS — BP 131/62 | HR 64 | Temp 98.5°F | Resp 18 | Ht 66.0 in | Wt 238.6 lb

## 2015-06-07 DIAGNOSIS — R293 Abnormal posture: Secondary | ICD-10-CM

## 2015-06-07 DIAGNOSIS — C50912 Malignant neoplasm of unspecified site of left female breast: Secondary | ICD-10-CM | POA: Insufficient documentation

## 2015-06-07 DIAGNOSIS — C50412 Malignant neoplasm of upper-outer quadrant of left female breast: Secondary | ICD-10-CM

## 2015-06-07 DIAGNOSIS — Z170 Estrogen receptor positive status [ER+]: Secondary | ICD-10-CM | POA: Diagnosis not present

## 2015-06-07 LAB — CBC WITH DIFFERENTIAL/PLATELET
BASO%: 1.1 % (ref 0.0–2.0)
BASOS ABS: 0.1 10*3/uL (ref 0.0–0.1)
EOS%: 2.6 % (ref 0.0–7.0)
Eosinophils Absolute: 0.2 10*3/uL (ref 0.0–0.5)
HEMATOCRIT: 40.4 % (ref 34.8–46.6)
HEMOGLOBIN: 13.5 g/dL (ref 11.6–15.9)
LYMPH#: 2.9 10*3/uL (ref 0.9–3.3)
LYMPH%: 37.2 % (ref 14.0–49.7)
MCH: 31.1 pg (ref 25.1–34.0)
MCHC: 33.3 g/dL (ref 31.5–36.0)
MCV: 93.4 fL (ref 79.5–101.0)
MONO#: 0.7 10*3/uL (ref 0.1–0.9)
MONO%: 9.1 % (ref 0.0–14.0)
NEUT#: 3.9 10*3/uL (ref 1.5–6.5)
NEUT%: 50 % (ref 38.4–76.8)
Platelets: 289 10*3/uL (ref 145–400)
RBC: 4.33 10*6/uL (ref 3.70–5.45)
RDW: 13 % (ref 11.2–14.5)
WBC: 7.8 10*3/uL (ref 3.9–10.3)

## 2015-06-07 LAB — COMPREHENSIVE METABOLIC PANEL (CC13)
ALK PHOS: 72 U/L (ref 40–150)
ALT: 21 U/L (ref 0–55)
AST: 21 U/L (ref 5–34)
Albumin: 3.3 g/dL — ABNORMAL LOW (ref 3.5–5.0)
Anion Gap: 9 mEq/L (ref 3–11)
BILIRUBIN TOTAL: 0.3 mg/dL (ref 0.20–1.20)
BUN: 23.3 mg/dL (ref 7.0–26.0)
CALCIUM: 9.4 mg/dL (ref 8.4–10.4)
CO2: 26 meq/L (ref 22–29)
CREATININE: 1.2 mg/dL — AB (ref 0.6–1.1)
Chloride: 105 mEq/L (ref 98–109)
EGFR: 52 mL/min/{1.73_m2} — AB (ref >90)
Glucose: 126 mg/dl (ref 70–140)
Potassium: 3.6 mEq/L (ref 3.5–5.1)
Sodium: 140 mEq/L (ref 136–145)
TOTAL PROTEIN: 6.9 g/dL (ref 6.4–8.3)

## 2015-06-07 NOTE — Progress Notes (Signed)
[facility]  [phone] [phone]     ID: DONNAMAE MUILENBURG DOB: 12-03-1955  MR#: [id number]  TGG#:[id number]  Patient Care Team: Fayrene Helper, MD as PCP - General Excell Seltzer, MD as Consulting Physician (General Surgery) Chauncey Cruel, MD as Consulting Physician (Oncology) Kyung Rudd, MD as Consulting Physician (Radiation Oncology) Mauro Kaufmann, RN as Registered Nurse Rockwell Germany, RN as Registered Nurse Sylvan Cheese, NP as Nurse Practitioner (Nurse Practitioner) PCP: Tula Nakayama, MD GYN: Gustavo Lah WHNP-BC:  OTHER MD:  CHIEF COMPLAINT: Estrogen receptor positive breast cancer  CURRENT TREATMENT: Awaiting definitive surgery   BREAST CANCER HISTORY: Kristi had screening mammography at [facility] 05/25/2015. This showed a spiculated mass in the left breast and the patient was referred to [facility] where on 05/31/2015 she underwent ultrasonography of the left breast. This showed a 1 cm lobulated mass in the left breast superiorly, measuring 1.0 cm. This was felt to be highly suspicious. Accordingly on 05/31/2015 the patient underwent left breast biopsy showing ([id number]) an invasive ductal carcinoma, grade 1 or 2 , estrogen and progesterone receptor positive, with an MIB-1 of 8, and HER-2 not amplified, the signals ratio being 1.33 and the number per cell 2.85.  Her subsequent history is as detailed below  INTERVAL HISTORY: Catlyn was evaluated in the multidisciplinary breast cancer clinic 06/07/2015 accompanied by her spouse Burman Nieves.Her case was also presented at the multidisciplinary breast cancer clinic the same day. At that time a preliminary plan was proposed:  Breast conserving surgery with sentinel lymph node sampling, with Oncotype testing if the tumor proves to be lymph node negative. The patient would benefit from radiation adjuvantly as well as from anti-estrogens following radiation.  REVIEW OF  SYSTEMS: Jeni does complain of forgetfulness, insomnia, pains in her legs feet back and arms, recent episode of left Bell's palsy which is resolving, runny nose, sore throat, and occasionally productive cough, weight concerns (she is planning on bariatric surgery soon) heartburn, history of hiatal hernia, rosacea, occasional headaches, anxiety, depression, phobias, and hot flashes. She has a history of PTSD. A detailed review of systems was otherwise entirely negative.  PAST MEDICAL HISTORY: Past Medical History  Diagnosis Date  . ALLERGIC RHINITIS   . Hypertension   . Herpes zoster   . Hyperlipidemia   . Diabetes mellitus   . Depression   . Cataract     beginning to form  . Breast cancer of upper-outer quadrant of left female breast 06/02/2015  . Breast cancer   . Anxiety   . GERD (gastroesophageal reflux disease)   . Arthritis     PAST SURGICAL HISTORY: Past Surgical History  Procedure Laterality Date  . Broken leg had to get pinned  12/23/2008  . Cholecystectomy  04/2010  . Bone exostosis excision  09/12/2011    Procedure: EXOSTOSIS EXCISION;  Surgeon: Marcheta Grammes;  Location: [facility];  Service: Orthopedics;  Laterality: Right;  Retrocalcaneal Exostectomy Right Foot    FAMILY HISTORY Family History  Problem Relation Age of Onset  . Cancer Mother     lung  . Hypertension Sister     x2  . Colon polyps Sister 19  . Heart disease Brother   . Esophageal cancer Brother   . Hyperlipidemia Brother   . Cancer Brother   . Depression Sister   . Anesthesia problems Neg Hx   . Colon cancer Neg Hx   . Rectal cancer Neg Hx   . Stomach  cancer Neg Hx    the patient's father died in an automobile accident the edge of 46. The patient's mother was diagnosed with lung cancer at the age of 46, shortly before her death from that call us. She was a smoker. The patient had 4 brothers, 2 sisters. One brother was diagnosed with esophageal cancer at age 25. There is no history of breast  or ovarian cancer in the family.  GYNECOLOGIC HISTORY:  No LMP recorded. Patient is not currently having periods (Reason: Perimenopausal). Menarche age 23, the patient is GX P0. She stopped having periods approximately age 59. She took hormone replacement approximately one year. 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 She married Setareh Rom 08/15/2009 in [place]. He is just the 2 of them at home, with 2 dogs. They also foster children at times. They used to live in [place], so they have Their medical care in this area. 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 The patient is not a church attender    ADVANCED DIRECTIVES: In place   HEALTH MAINTENANCE: History  Substance Use Topics  . Smoking status: Never Smoker   . Smokeless tobacco: Never Used  . Alcohol Use: No     Colonoscopy: SEPT 2013  PAP:  Bone density: never Lipid panel:  Allergies  Allergen Reactions  . Duloxetine     REACTION: sxual side effects  . Pravastatin Other (See Comments)    Muscle aches  . Mushroom Extract Complex Nausea And Vomiting    Current Outpatient Prescriptions  Medication Sig Dispense Refill  . alprazolam (XANAX) 2 MG tablet TAKE 1 TABLET BY MOUTH EVERY NIGHT AT BEDTIME AS NEEDED FOR SLEEP 30 tablet 1  . aspirin EC 81 MG tablet Take 81 mg by mouth daily.      Marland Kitchen atenolol-chlorthalidone (TENORETIC) 50-25 MG per tablet TAKE 1 TABLET BY MOUTH EVERY DAY 30 tablet 3  . BENICAR 40 MG tablet TAKE ONE TABLET EACH DAY 30 tablet 3  . buPROPion (WELLBUTRIN XL) 300 MG 24 hr tablet TAKE 1 TABLET BY MOUTH EVERY MORNING 30 tablet 2  . temazepam (RESTORIL) 30 MG capsule      No current facility-administered medications for this visit.    OBJECTIVE: Filed Vitals:   06/07/15 1302  BP: 131/62  Pulse: 64  Temp: 98.5 F (36.9 C)  Resp: 18     Body mass index is 38.53 kg/(m^2).    ECOG FS:1 - Symptomatic but  completely ambulatory  Ocular: Sclerae unicteric, pupils equal, round and reactive to light Ear-nose-throat: Oropharynx clear and moist Lymphatic: No cervical or supraclavicular adenopathy Lungs no rales or rhonchi, good excursion bilaterally Heart regular rate and rhythm, no murmur appreciated Abd soft, obese, nontender, positive bowel sounds MSK no focal spinal tenderness Neuro: Minimal residual left facial palsy (only noticeable in the left mouth area) otherwise non-focal, somewhat forgetful and vague in answers, but appropriate affect Breasts: The right breast is unremarkable. The left breast is status post recent biopsy. There is a significant ecchymosis. There are no skin or nipple changes of concern otherwise. The left axilla is benign   LAB RESULTS:  CMP     Component Value Date/Time   NA 140 06/07/2015 1219   NA 140 12/13/2013 1230   K 3.6 06/07/2015 1219   K 4.3 12/13/2013 1230   CL 105 12/13/2013 1230   CO2 26 06/07/2015 1219  CO2 26 12/13/2013 1230   GLUCOSE 126 06/07/2015 1219   GLUCOSE 89 12/13/2013 1230   BUN 23.3 06/07/2015 1219   BUN 20 12/13/2013 1230   CREATININE 1.2* 06/07/2015 1219   CREATININE 1.01 12/13/2013 1230   CREATININE 1.24* 09/05/2011 1130   CREATININE 0.98 12/26/2008 1626   CALCIUM 9.4 06/07/2015 1219   CALCIUM 9.2 12/13/2013 1230   PROT 6.9 06/07/2015 1219   PROT 7.1 12/13/2013 1230   ALBUMIN 3.3* 06/07/2015 1219   ALBUMIN 4.1 12/13/2013 1230   AST 21 06/07/2015 1219   AST 22 12/13/2013 1230   ALT 21 06/07/2015 1219   ALT 23 12/13/2013 1230   ALKPHOS 72 06/07/2015 1219   ALKPHOS 74 12/13/2013 1230   BILITOT 0.30 06/07/2015 1219   BILITOT 0.4 12/13/2013 1230   GFRNONAA 62 12/13/2013 1230   GFRNONAA 48* 09/05/2011 1130   GFRAA 71 12/13/2013 1230   GFRAA 56* 09/05/2011 1130    INo results found for: SPEP, UPEP  Lab Results  Component Value Date   WBC 7.8 06/07/2015   NEUTROABS 3.9 06/07/2015   HGB 13.5 06/07/2015   HCT 40.4  06/07/2015   MCV 93.4 06/07/2015   PLT 289 06/07/2015      Chemistry      Component Value Date/Time   NA 140 06/07/2015 1219   NA 140 12/13/2013 1230   K 3.6 06/07/2015 1219   K 4.3 12/13/2013 1230   CL 105 12/13/2013 1230   CO2 26 06/07/2015 1219   CO2 26 12/13/2013 1230   BUN 23.3 06/07/2015 1219   BUN 20 12/13/2013 1230   CREATININE 1.2* 06/07/2015 1219   CREATININE 1.01 12/13/2013 1230   CREATININE 1.24* 09/05/2011 1130   CREATININE 0.98 12/26/2008 1626      Component Value Date/Time   CALCIUM 9.4 06/07/2015 1219   CALCIUM 9.2 12/13/2013 1230   ALKPHOS 72 06/07/2015 1219   ALKPHOS 74 12/13/2013 1230   AST 21 06/07/2015 1219   AST 22 12/13/2013 1230   ALT 21 06/07/2015 1219   ALT 23 12/13/2013 1230   BILITOT 0.30 06/07/2015 1219   BILITOT 0.4 12/13/2013 1230       No results found for: LABCA2  No components found for: LABCA125  No results for input(s): INR in the last 168 hours.  Urinalysis    Component Value Date/Time   COLORURINE YELLOW 04/25/2010 2211   APPEARANCEUR CLEAR 04/25/2010 2211   LABSPEC >1.030* 04/25/2010 2211   PHURINE 6.0 04/25/2010 2211   GLUCOSEU NEGATIVE 04/25/2010 2211   GLUCOSEU NEGATIVE 12/30/2007 1520   HGBUR TRACE* 04/25/2010 2211   BILIRUBINUR NEGATIVE 04/25/2010 2211   KETONESUR TRACE* 04/25/2010 2211   PROTEINUR TRACE* 04/25/2010 2211   UROBILINOGEN 0.2 04/25/2010 2211   NITRITE NEGATIVE 04/25/2010 2211   LEUKOCYTESUR NEGATIVE 04/25/2010 2211    STUDIES: No results found.  ASSESSMENT: 59 y.o. [place], [place] woman status post left breast biopsy 05/31/2015 for a clinical T1b N0, stage IA  Invasive ductal carcinoma, low to intermediate grade, estrogen and progesterone receptor positive, with HER-2 not amplified, and an MIB-1 of 8%  PLAN: We spent the better part of today's hour-long appointment discussing the biology of breast cancer in general, and the specifics of the patient's tumor in particular. Jezabella understands  she has a small, slow-growing invasive ductal breast cancer which is in the nonaggressive side (grade 1 or 2). This suggests that the benefit of chemotherapy may be marginal.  For that reason we are going to request an  Oncotype from the definitive surgical specimen. I expected to be in the low risk category, but it might be in the intermediate range. She understands this takes approximately 2 weeks to come back and therefore I'm going to see her in approximately 6 weeks, giving time for her surgery to be scheduled and for her to recover from it.   We reviewed the fact that there is no survival benefit to mastectomy over lumpectomy and radiation, but she is exceedingly anxious regarding developing cancer in the contralateral breast or having breast cancer, can the same breast, and with her history of posttraumatic stress disorder I think bilateral mastectomies for that reason is justified.  If she has no lymph node involvement and margins are good, both of which are likely, she will not need radiation therapy  Today we discussed antiestrogen's in a preliminary fashion. We will discuss it in more detail when she returns to see me. I have asked her to obtain a bone density scan locally (I don't want to make her drive 3 hours just for that test here) and to bring me the results. That will help [place] in choosing the anti-estrogens and or managing the side effects if we decide on aromatase inhibitors.  Tanazia has a good understanding of the overall plan. She agrees with it. She knows the goal of treatment in her case is cure. She will call with any problems that may develop before her next visit here.  Chauncey Cruel, MD   06/07/2015 4:11 PM Medical Oncology and Hematology [facility] [address] [place], [place] [address] Tel. [phone]    Fax. [phone]

## 2015-06-07 NOTE — Therapy (Signed)
[place], [place], [address] Phone: [phone]   Fax:  [phone]  Physical Therapy Evaluation  Patient Details  Name: Jennifer [name] MRN: [id number] Date of Birth: August 06, 1956 Referring Provider:  Excell Seltzer, MD  Encounter Date: 06/07/2015      PT End of Session - 06/07/15 1640    Visit Number 1   Number of Visits 1   PT Start Time 4270   PT Stop Time 1412   PT Time Calculation (min) 37 min   Activity Tolerance Patient tolerated treatment well   Behavior During Therapy [facility] for tasks assessed/performed      Past Medical History  Diagnosis Date  . ALLERGIC RHINITIS   . Hypertension   . Herpes zoster   . Hyperlipidemia   . Diabetes mellitus   . Depression   . Cataract     beginning to form  . Breast cancer of upper-outer quadrant of left female breast 06/02/2015  . Breast cancer   . Anxiety   . GERD (gastroesophageal reflux disease)   . Arthritis     Past Surgical History  Procedure Laterality Date  . Broken leg had to get pinned  12/23/2008  . Cholecystectomy  04/2010  . Bone exostosis excision  09/12/2011    Procedure: EXOSTOSIS EXCISION;  Surgeon: Marcheta Grammes;  Location: [facility];  Service: Orthopedics;  Laterality: Right;  Retrocalcaneal Exostectomy Right Foot    There were no vitals filed for this visit.  Visit Diagnosis:  Breast cancer, left - Plan: PT plan of care cert/re-cert  Abnormal posture - Plan: PT plan of care cert/re-cert      Subjective Assessment - 06/07/15 1640    Pertinent History Patient was diagnosed with left ER/PR positive, HER2 negative breast cancer with a Ki67 of 8%. It measures 1 cm in size and is located in the upper outer quadrant.            [facility] PT Assessment - 06/07/15 0001    Assessment   Medical Diagnosis Left breast cancer   Onset Date/Surgical Date 05/25/15   Hand Dominance Right   Prior Therapy no   Precautions    Precautions Other (comment)  Active breast cancer   Restrictions   Weight Bearing Restrictions No   Balance Screen   Has the patient fallen in the past 6 months No   Has the patient had a decrease in activity level because of a fear of falling?  No   Is the patient reluctant to leave their home because of a fear of falling?  No   Home Ecologist residence   Living Arrangements Spouse/significant other  Lives with her wife   Available Help at Discharge Family   Prior Function   Level of Independence Independent   Vocation Unemployed   Market researcher   Leisure She does strenuous yardwork   Cognition   Overall Cognitive Status Within Functional Limits for tasks assessed   Posture/Postural Control   Posture/Postural Control Postural limitations   Postural Limitations Forward head;Rounded Shoulders   ROM / Strength   AROM / PROM / Strength AROM;Strength   AROM   AROM Assessment Site Shoulder   Right/Left Shoulder Right;Left   Right Shoulder Extension 40 Degrees   Right Shoulder Flexion 153 Degrees   Right Shoulder ABduction 160 Degrees   Right Shoulder Internal Rotation 60 Degrees   Right Shoulder External Rotation 82 Degrees   Left Shoulder Extension  39 Degrees   Left Shoulder Flexion 157 Degrees   Left Shoulder ABduction 142 Degrees   Left Shoulder Internal Rotation 65 Degrees   Left Shoulder External Rotation 87 Degrees   Strength   Overall Strength Within functional limits for tasks performed           LYMPHEDEMA/ONCOLOGY QUESTIONNAIRE - 06/07/15 1625    Type   Cancer Type Left breast cancer   Lymphedema Assessments   Lymphedema Assessments Upper extremities   Right Upper Extremity Lymphedema   10 cm Proximal to Olecranon Process 34 cm   Olecranon Process 28.2 cm   10 cm Proximal to Ulnar Styloid Process 27 cm   Just Proximal to Ulnar Styloid Process 17.5 cm   Across Hand at [employer] 20.4 cm   At [place] of 2nd  Digit 6.8 cm   Left Upper Extremity Lymphedema   10 cm Proximal to Olecranon Process 33.7 cm   Olecranon Process 28.6 cm   10 cm Proximal to Ulnar Styloid Process 25.2 cm   Just Proximal to Ulnar Styloid Process 16.8 cm   Across Hand at [employer] 20 cm   At [place] of 2nd Digit 6.6 cm      Patient was instructed today in a home exercise program today for post op shoulder range of motion. These included active assist shoulder flexion in sitting, scapular retraction, wall walking with shoulder abduction, and hands behind head external rotation.  She was encouraged to do these twice a day, holding 3 seconds and repeating 5 times when permitted by her physician.         PT Education - 06/07/15 1638    Education provided Yes   Education Details Lymphedema risk reduction and post op shoulder ROM HEP   Person(s) Educated Patient;Spouse   Methods Explanation;Demonstration;Handout   Comprehension Verbalized understanding;Returned demonstration              Breast Clinic Goals - 06/07/15 1643    Patient will be able to verbalize understanding of pertinent lymphedema risk reduction practices relevant to her diagnosis specifically related to skin care.   Time 1   Period Days   Status Achieved   Patient will be able to return demonstrate and/or verbalize understanding of the post-op home exercise program related to regaining shoulder range of motion.   Time 1   Period Days   Status Achieved   Patient will be able to verbalize understanding of the importance of attending the postoperative After Breast Cancer Class for further lymphedema risk reduction education and therapeutic exercise.   Time 1   Period Days   Status Achieved       Patient will follow up at outpatient cancer rehab if needed following surgery.  If the patient requires physical therapy at that time, a specific plan will be dictated and sent to the referring physician for approval. The patient was educated today on  appropriate basic range of motion exercises to begin post operatively and the importance of attending the After Breast Cancer class following surgery.  Patient was educated today on lymphedema risk reduction practices as it pertains to recommendations that will benefit the patient immediately following surgery.  She verbalized good understanding.  No additional physical therapy is indicated at this time.           Plan - 06/07/15 1641    Clinical Impression Statement Patient was diagnosed with left ER/PR positive, HER2 negative breast cancer with a Ki67 of 8%. It measures 1  cm in size and is located in the upper outer quadrant.  She is planning to have a bilateral mastectomy with a left sentinel node biopsy followed by anti-estrogen therapy.    She may benefit from post op PT to regain shoulder ROM and strength and reduce her risk for lymphedema.   Pt will benefit from skilled therapeutic intervention in order to improve on the following deficits Decreased range of motion;Pain;Decreased knowledge of precautions;Impaired UE functional use;Decreased strength;Increased edema   Rehab Potential Good   Clinical Impairments Affecting Rehab Potential comorbidities   PT Frequency One time visit   PT Treatment/Interventions Patient/family education;Therapeutic exercise   Consulted and Agree with Plan of Care Patient;Family member/caregiver   Family Member Consulted Spouse         Problem List Patient Active Problem List   Diagnosis Date Noted  . Breast cancer of upper-outer quadrant of left female breast 06/02/2015  . Metabolic syndrome X 63/14/9702  . Insomnia 04/18/2011  . Diabetes mellitus with nephropathy 03/14/2011  . POSTMENOPAUSAL BLEEDING 07/24/2010  . Hyperlipidemia LDL goal <100 04/25/2010  . Morbid obesity 04/25/2010  . FATIGUE 04/25/2010  . VITAMIN D DEFICIENCY 01/01/2008  . Depression with anxiety 12/30/2007  . Essential hypertension 12/30/2007  . ALLERGIC RHINITIS 12/30/2007    Annia Friendly, PT 06/07/2015 4:45 PM  [place], [place], [address] Phone: [phone]   Fax:  [phone]

## 2015-06-07 NOTE — Progress Notes (Signed)
Checked in new pt with no financial concerns prior to seeing the dr but may need lodging assistance if she will need chemo or radiation.  Informed pt if chemo is part of her treatment we will contact her ins to see if Josem Kaufmann is required and will obtain it if it is as well as contact foundations that offer copay assistance for chemo if needed. Gave pt a Warden/ranger from [employer] highlighting the resources for transportation and lodging as well as the application for ACS which she will hold on to until her treatment plan is established.  She has my card for any billing questions or concerns.

## 2015-06-07 NOTE — Progress Notes (Signed)
Radiation Oncology         [phone] ________________________________  Name: Jennifer [name] MRN: [id number]  Date: 06/07/2015  DOB: Apr 08, 1956  [id number] Moshe Cipro, MD  Excell Seltzer, MD     REFERRING PHYSICIAN: Excell Seltzer, MD   DIAGNOSIS: The encounter diagnosis was Breast cancer of upper-outer quadrant of left female breast.  Breast cancer of upper-outer quadrant of left female breast   Staging form: Breast, AJCC 7th Edition     Clinical: Stage IA (T1b, N0, M0) - Signed by Chauncey Cruel, MD on 06/07/2015   HISTORY OF PRESENT ILLNESS::Jennifer [name] is a 59 y.o. female who is seen for an initial consultation visit regarding the patient's diagnosis of breast cancer.  The patient was found to have suspicious findings within the left breast on initial mammogram. The patient has not had symptoms prior to this study. A diagnostic mammogram and breast ultrasound confirmed this finding. On ultrasound, the tumor measured 1.0 cm and was present in the upper-outer quadrant.  A biopsy was performed. This revealed invasive ductal carcinoma grade I/II. Receptors studies were completed and indicate that the tumor is estrogen receptor postive, progesterone receptor positive, and Her-2/neu negative. The Ki-67 staining was 8%.  An Oncotype will be ordered.  The patient has not undergone an MRI scan of the breasts.  Patient notes she is on waiting list for bariatric surgery.   PREVIOUS RADIATION THERAPY: No   PAST MEDICAL HISTORY:  has a past medical history of ALLERGIC RHINITIS; Hypertension; Herpes zoster; Hyperlipidemia; Diabetes mellitus; Depression; Cataract; Breast cancer of upper-outer quadrant of left female breast (06/02/2015); Breast cancer; Anxiety; GERD (gastroesophageal reflux disease); and Arthritis.     PAST SURGICAL HISTORY: Past Surgical History  Procedure Laterality Date  . Broken leg had to get pinned  12/23/2008  . Cholecystectomy  04/2010  . Bone exostosis  excision  09/12/2011    Procedure: EXOSTOSIS EXCISION;  Surgeon: Marcheta Grammes;  Location: [facility];  Service: Orthopedics;  Laterality: Right;  Retrocalcaneal Exostectomy Right Foot     FAMILY HISTORY: family history includes Cancer in her brother and mother; Colon polyps (age of onset: 23) in her sister; Depression in her sister; Esophageal cancer in her brother; Heart disease in her brother; Hyperlipidemia in her brother; Hypertension in her sister. There is no history of Anesthesia problems, Colon cancer, Rectal cancer, or Stomach cancer.   SOCIAL HISTORY:  reports that she has never smoked. She has never used smokeless tobacco. She reports that she does not drink alcohol or use illicit drugs.   ALLERGIES: Duloxetine; Pravastatin; and Mushroom extract complex   MEDICATIONS:  Current Outpatient Prescriptions  Medication Sig Dispense Refill  . alprazolam (XANAX) 2 MG tablet TAKE 1 TABLET BY MOUTH EVERY NIGHT AT BEDTIME AS NEEDED FOR SLEEP 30 tablet 1  . aspirin EC 81 MG tablet Take 81 mg by mouth daily.      Marland Kitchen atenolol-chlorthalidone (TENORETIC) 50-25 MG per tablet TAKE 1 TABLET BY MOUTH EVERY DAY 30 tablet 3  . BENICAR 40 MG tablet TAKE ONE TABLET EACH DAY 30 tablet 3  . buPROPion (WELLBUTRIN XL) 300 MG 24 hr tablet TAKE 1 TABLET BY MOUTH EVERY MORNING 30 tablet 2  . temazepam (RESTORIL) 30 MG capsule      No current facility-administered medications for this encounter.     REVIEW OF SYSTEMS:  A 15 point review of systems is documented in the electronic medical record. This was obtained by the nursing staff. However, I reviewed  this with the patient to discuss relevant findings and make appropriate changes.  Pertinent items are noted in HPI.    PHYSICAL EXAM:  Vitals with Age-Percentiles 06/07/2015  Length 712.4 cm  Systolic 580  Diastolic 62  Pulse 64  Respiration 18  Weight 108.228 kg  BMI 38.6         ECOG = 0  0 - Asymptomatic (Fully active, able to carry on  all predisease activities without restriction)  1 - Symptomatic but completely ambulatory (Restricted in physically strenuous activity but ambulatory and able to carry out work of a light or sedentary nature. For example, light housework, office work)  2 - Symptomatic, <50% in bed during the day (Ambulatory and capable of all self care but unable to carry out any work activities. Up and about more than 50% of waking hours)  3 - Symptomatic, >50% in bed, but not bedbound (Capable of only limited self-care, confined to bed or chair 50% or more of waking hours)  4 - Bedbound (Completely disabled. Cannot carry on any self-care. Totally confined to bed or chair)  5 - Death   Eustace Pen MM, Creech RH, Tormey DC, et al. [phone]). "Toxicity and response criteria of the [facility]". [place] Oncol. 5 (6): 649-55  General: Well-developed, in no acute distress HEENT: Normocephalic, atraumatic; oral cavity clear Neck: Supple without any lymphadenopathy Cardiovascular: Regular rate and rhythm Respiratory: Clear to auscultation bilaterally Breasts: Moderate bruising at biopsy site, no lymphadenopathies, otherwise normal appearing GI: Soft, nontender, normal bowel sounds Extremities: No edema present Neuro: No focal deficits     LABORATORY DATA:  Lab Results  Component Value Date   WBC 7.8 06/07/2015   HGB 13.5 06/07/2015   HCT 40.4 06/07/2015   MCV 93.4 06/07/2015   PLT 289 06/07/2015   Lab Results  Component Value Date   NA 140 06/07/2015   K 3.6 06/07/2015   CL 105 12/13/2013   CO2 26 06/07/2015   Lab Results  Component Value Date   ALT 21 06/07/2015   AST 21 06/07/2015   ALKPHOS 72 06/07/2015   BILITOT 0.30 06/07/2015      RADIOGRAPHY: No results found.     IMPRESSION:    Breast cancer of upper-outer quadrant of left female breast   05/31/2015 Receptors her2 ER/PR+ HER2 - NEGATIVE:Ki67- 8%   05/31/2015 Initial Biopsy Breast, left, needle core biopsy -  INVASIVE DUCTAL CARCINOMA   06/02/2015 Initial Diagnosis Breast cancer of upper-outer quadrant of left female breast    The patient has a recent diagnosis of invasive ductal carcinoma of the left breast. She appears to be a good candidate for breast conservation treatment.  I discussed with the patient the role of adjuvant radiation treatment in this setting. We discussed the potential benefit of radiation treatment, especially with regards to local control of the patient's tumor. We also discussed the possible side effects and risks of such a treatment as well.  All of the patient's questions were answered. At this time the patient appears confident in proceeding with bilateral mastectomy. If patient wishes to undergo reconstruction I would not anticipate post-mastectomy radiation. Patient would be a candidate for immediate reconstruction if radiation is not needed.  PLAN: I look forward to reviewing the patient's case postoperatively and follow up as needed.     ________________________________   Jodelle Gross, MD, PhD    This document serves as a record of services personally performed by Kyung Rudd, MD. It was  created on his behalf by Derek Mound, a trained medical scribe. The creation of this record is based on the scribe's personal observations and the provider's statements to them. This document has been checked and approved by the attending provider.

## 2015-06-07 NOTE — Progress Notes (Signed)
Ms. Debell is a very pleasant 59 y.o. female from [place], [place] with newly diagnosed grade 1-2 invasive ductal carcinoma of the left breast.  Biopsy results revealed the tumor's prognostic profile is ER positive, PR positive, and HER2/neu negative. Ki67 is 8%.  She presents today with her spouse to the [facility]) for treatment consideration and recommendations from the breast surgeon, radiation oncologist, and medical oncologist.     I briefly met with Ms. Hands and her spouse, Burman Nieves, during her [facility] visit today. We discussed the purpose of the Survivorship Clinic, which will include monitoring for recurrence, coordinating completion of age and gender-appropriate cancer screenings, promotion of overall wellness, as well as managing potential late/long-term side effects of anti-cancer treatments.    The treatment plan for Ms. Ponder will likely include surgery and anti-estrogen therapy.  As of today, the intent of treatment for Ms. Prada is cure, therefore she will be eligible for the Survivorship Clinic upon her completion of treatment.  Her survivorship care plan (SCP) document will be drafted and updated throughout the course of her treatment trajectory. She will receive the SCP in an office visit with myself in the Survivorship Clinic once she has completed treatment.   Ms. Fiorenza was encouraged to ask questions and all questions were answered to her satisfaction.  She was given my business card and encouraged to contact me with any concerns regarding survivorship.  I look forward to participating in her care.   Kenn File, NP [place] [phone]

## 2015-06-07 NOTE — Patient Instructions (Signed)

## 2015-06-08 ENCOUNTER — Encounter: Payer: Self-pay | Admitting: *Deleted

## 2015-06-08 ENCOUNTER — Telehealth: Payer: Self-pay | Admitting: Oncology
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
## 2015-06-08 NOTE — Telephone Encounter (Signed)
Confirmed appointment for August. °

## 2015-06-09 ENCOUNTER — Encounter: Payer: Self-pay | Admitting: Family Medicine

## 2015-06-09 ENCOUNTER — Encounter: Payer: Self-pay | Admitting: Radiation Oncology

## 2015-06-16 ENCOUNTER — Telehealth: Payer: Self-pay | Admitting: Oncology

## 2015-06-16 ENCOUNTER — Other Ambulatory Visit: Payer: Self-pay | Admitting: *Deleted

## 2015-06-16 NOTE — Telephone Encounter (Signed)
lvm for pt regarding Aug appt cx and moved to Sept.....mailed pt appt sched and letter

## 2015-06-17 ENCOUNTER — Other Ambulatory Visit: Payer: Self-pay | Admitting: Family Medicine

## 2015-06-23 ENCOUNTER — Telehealth: Payer: Self-pay | Admitting: *Deleted

## 2015-06-23 NOTE — Pre-Procedure Instructions (Signed)
EBONIQUE HALLSTROM  06/23/2015    Your procedure is scheduled on : Monday July 03, 2015 at 11:00 AM.  Report to [facility] Admitting at 9:00 A.M.  Call this number if you have problems the morning of surgery: [phone]   Remember:  Do not eat food or drink liquids after midnight.  Take these medicines the morning of surgery with A SIP OF WATER : Atenolol, Buproprion (Wellbutrin), Buspirone (Buspar), Flonase nasal spray   Please stop taking any aspirin, vitamins, herbal medications, etc on Wednesday August 17th   Do not wear jewelry, make-up or nail polish.  Do not wear lotions, powders, or perfumes.  You may NOT wear deodorant.  Do not shave 48 hours prior to surgery.    Do not bring valuables to the hospital.  [facility] is not responsible for any belongings or valuables.  Contacts, dentures or bridgework may not be worn into surgery.  Leave your suitcase in the car.  After surgery it may be brought to your room.  For patients admitted to the hospital, discharge time will be determined by your treatment team.  Patients discharged the day of surgery will not be allowed to drive home.   Name and phone number of your driver:    Special instructions:  Shower using CHG soap the night before and the morning of your surgery  Please read over the following fact sheets that you were given. Pain Booklet, Coughing and Deep Breathing and Surgical Site Infection Prevention

## 2015-06-23 NOTE — Telephone Encounter (Signed)
-----   Message from Rockwell Germany, RN sent at 06/19/2015  2:00 PM EDT ----- Regarding: Care Plan Patient was seen in [facility] 8/3.  She is scheduled for the following.  Surgery (Bilateral Mastectomies) - 8/22 Dr. Jana Hakim - 9/12  An oncotype will be ordered based on final pathology results.  Thanks,  Varney Biles

## 2015-06-26 ENCOUNTER — Encounter: Payer: Self-pay | Admitting: Family Medicine

## 2015-06-26 ENCOUNTER — Ambulatory Visit: Payer: BLUE CROSS/BLUE SHIELD | Admitting: Family Medicine

## 2015-06-26 ENCOUNTER — Encounter (HOSPITAL_COMMUNITY)
Admission: RE | Admit: 2015-06-26 | Discharge: 2015-06-26 | Disposition: A | Discharge status: Home / Self Care | Payer: BLUE CROSS/BLUE SHIELD | Source: Ambulatory Visit | Attending: General Surgery | Admitting: General Surgery

## 2015-06-26 ENCOUNTER — Encounter (HOSPITAL_COMMUNITY): Payer: Self-pay

## 2015-06-26 DIAGNOSIS — I10 Essential (primary) hypertension: Secondary | ICD-10-CM | POA: Diagnosis not present

## 2015-06-26 DIAGNOSIS — Z01812 Encounter for preprocedural laboratory examination: Secondary | ICD-10-CM | POA: Diagnosis not present

## 2015-06-26 DIAGNOSIS — C50912 Malignant neoplasm of unspecified site of left female breast: Secondary | ICD-10-CM | POA: Diagnosis not present

## 2015-06-26 DIAGNOSIS — R001 Bradycardia, unspecified: Secondary | ICD-10-CM | POA: Diagnosis not present

## 2015-06-26 HISTORY — DX: Bell's palsy: G51.0

## 2015-06-26 HISTORY — DX: Pneumonia, unspecified organism: J18.9

## 2015-06-26 HISTORY — DX: Frequency of micturition: R35.0

## 2015-06-26 HISTORY — DX: Post-traumatic stress disorder, unspecified: F43.10

## 2015-06-26 LAB — BASIC METABOLIC PANEL
Anion gap: 9 (ref 5–15)
BUN: 24 mg/dL — ABNORMAL HIGH (ref 6–20)
CO2: 29 mmol/L (ref 22–32)
Calcium: 9.6 mg/dL (ref 8.9–10.3)
Chloride: 101 mmol/L (ref 101–111)
Creatinine, Ser: 1.19 mg/dL — ABNORMAL HIGH (ref 0.44–1.00)
GFR calc non Af Amer: 49 mL/min — ABNORMAL LOW (ref >60)
GFR, EST AFRICAN AMERICAN: 57 mL/min — AB (ref >60)
Glucose, Bld: 129 mg/dL — ABNORMAL HIGH (ref 65–99)
Potassium: 3.6 mmol/L (ref 3.5–5.1)
Sodium: 139 mmol/L (ref 135–145)

## 2015-06-26 LAB — CBC
HEMATOCRIT: 40.2 % (ref 36.0–46.0)
Hemoglobin: 13.4 g/dL (ref 12.0–15.0)
MCH: 31 pg (ref 26.0–34.0)
MCHC: 33.3 g/dL (ref 30.0–36.0)
MCV: 93.1 fL (ref 78.0–100.0)
Platelets: 272 10*3/uL (ref 150–400)
RBC: 4.32 MIL/uL (ref 3.87–5.11)
RDW: 13.3 % (ref 11.5–15.5)
WBC: 8.8 10*3/uL (ref 4.0–10.5)

## 2015-06-26 LAB — GLUCOSE, CAPILLARY: GLUCOSE-CAPILLARY: 156 mg/dL — AB (ref 65–99)

## 2015-06-26 NOTE — Progress Notes (Signed)
PCP is Tula Nakayama  Patient informed Nurse that she had chest pain about two or three weeks ago. Patient stated "it started when I was feeding a crying baby, and the pain went away when the baby stopped crying." Patient denied having any current cardiac or pulmonary issues.  Nurse inquired about blood glucose and patient was unsure of the highest it had been, but stated the lowest her blood glucose had been was 98. Last A1C was 7.0 and patient stated that was back in January. Blood glucose on arrival to PAT was 156. Patient informed Nurse she had eaten today.   Nurse asked patient about having sleep apnea, and patient stated she had a sleep study at [facility] in [place], [place], and was wearing a CPAP machine at bedtime, but stated she was instructed to return it because she was not using it. Patient informed Nurse that she was using the machine for approximately two months before returning it.

## 2015-06-27 LAB — HEMOGLOBIN A1C
Hgb A1c MFr Bld: 7.4 % — ABNORMAL HIGH (ref 4.8–5.6)
MEAN PLASMA GLUCOSE: 166 mg/dL

## 2015-06-28 ENCOUNTER — Telehealth: Payer: Self-pay | Admitting: Family Medicine

## 2015-06-28 ENCOUNTER — Other Ambulatory Visit: Payer: Self-pay | Admitting: Family Medicine

## 2015-06-28 ENCOUNTER — Encounter: Payer: Self-pay | Admitting: Family Medicine

## 2015-06-28 NOTE — Telephone Encounter (Signed)
Pls call pt on 8/18 and find out the name of the psychiatrist that she wishes to see in [place], a referral [facility] entered suiince July, pt was to call back with name of Doc, she did  not call back with the info, ands in need of the care based on history and recent pt e mail sent Document tele messages that uou have attempted to call her if unsuccessful also pls, she DOES  Need to see psychiatry  Problems or questions pls let me know

## 2015-06-29 NOTE — Telephone Encounter (Signed)
I called patient and asked her about which psychiatrist she would like to see, pt stated she is so caught up in the breast doctor she hasn't even thought about it, pt states she is having surgery Monday and she sent Dr. Moshe Cipro a message in [place] discussing some other issues. Pt stated when she is ready to schedule her appt with a psychiatrist she will let us know. I made her aware to call us if she needs anything.

## 2015-07-03 ENCOUNTER — Encounter (HOSPITAL_COMMUNITY): Payer: Self-pay | Admitting: *Deleted

## 2015-07-03 ENCOUNTER — Ambulatory Visit (HOSPITAL_COMMUNITY)
Admission: RE | Admit: 2015-07-03 | Discharge: 2015-07-03 | Disposition: A | Discharge status: Home / Self Care | Payer: BLUE CROSS/BLUE SHIELD | Source: Ambulatory Visit | Attending: General Surgery | Admitting: General Surgery

## 2015-07-03 ENCOUNTER — Ambulatory Visit (HOSPITAL_COMMUNITY): Payer: BLUE CROSS/BLUE SHIELD | Admitting: Anesthesiology

## 2015-07-03 ENCOUNTER — Inpatient Hospital Stay (HOSPITAL_COMMUNITY)
Admission: RE | Admit: 2015-07-03 | Discharge: 2015-07-05 | DRG: 581 | Disposition: A | Discharge status: Home / Self Care | Payer: BLUE CROSS/BLUE SHIELD | Source: Ambulatory Visit | Attending: General Surgery | Admitting: General Surgery

## 2015-07-03 ENCOUNTER — Encounter (HOSPITAL_COMMUNITY)
Admission: AD | Disposition: A | Discharge status: Home / Self Care | Payer: Self-pay | Source: Ambulatory Visit | Attending: General Surgery

## 2015-07-03 DIAGNOSIS — E876 Hypokalemia: Secondary | ICD-10-CM | POA: Diagnosis present

## 2015-07-03 DIAGNOSIS — C50412 Malignant neoplasm of upper-outer quadrant of left female breast: Secondary | ICD-10-CM

## 2015-07-03 DIAGNOSIS — E118 Type 2 diabetes mellitus with unspecified complications: Secondary | ICD-10-CM | POA: Diagnosis present

## 2015-07-03 DIAGNOSIS — E780 Pure hypercholesterolemia: Secondary | ICD-10-CM | POA: Diagnosis present

## 2015-07-03 DIAGNOSIS — I252 Old myocardial infarction: Secondary | ICD-10-CM

## 2015-07-03 DIAGNOSIS — Z4001 Encounter for prophylactic removal of breast: Secondary | ICD-10-CM | POA: Diagnosis present

## 2015-07-03 DIAGNOSIS — F419 Anxiety disorder, unspecified: Secondary | ICD-10-CM | POA: Diagnosis present

## 2015-07-03 DIAGNOSIS — C50912 Malignant neoplasm of unspecified site of left female breast: Secondary | ICD-10-CM

## 2015-07-03 DIAGNOSIS — I10 Essential (primary) hypertension: Secondary | ICD-10-CM | POA: Diagnosis present

## 2015-07-03 DIAGNOSIS — Z170 Estrogen receptor positive status [ER+]: Secondary | ICD-10-CM

## 2015-07-03 DIAGNOSIS — F329 Major depressive disorder, single episode, unspecified: Secondary | ICD-10-CM | POA: Diagnosis present

## 2015-07-03 HISTORY — DX: Acute myocardial infarction, unspecified: I21.9

## 2015-07-03 HISTORY — DX: Localized edema: R60.0

## 2015-07-03 HISTORY — DX: Sleep apnea, unspecified: G47.30

## 2015-07-03 HISTORY — PX: MASTECTOMY COMPLETE / SIMPLE W/ SENTINEL NODE BIOPSY: SUR846

## 2015-07-03 HISTORY — PX: SIMPLE MASTECTOMY WITH AXILLARY SENTINEL NODE BIOPSY: SHX6098

## 2015-07-03 HISTORY — DX: Cardiac murmur, unspecified: R01.1

## 2015-07-03 HISTORY — PX: MASTECTOMY COMPLETE / SIMPLE: SUR845

## 2015-07-03 HISTORY — PX: TOTAL MASTECTOMY: SHX6129

## 2015-07-03 HISTORY — DX: Type 2 diabetes mellitus without complications: E11.9

## 2015-07-03 LAB — GLUCOSE, CAPILLARY
GLUCOSE-CAPILLARY: 140 mg/dL — AB (ref 65–99)
GLUCOSE-CAPILLARY: 154 mg/dL — AB (ref 65–99)

## 2015-07-03 SURGERY — SIMPLE MASTECTOMY WITH AXILLARY SENTINEL NODE BIOPSY
Anesthesia: Regional | Laterality: Right

## 2015-07-03 MED ORDER — METHYLENE BLUE 1 % INJ SOLN
INTRAMUSCULAR | Status: AC
  Filled 2015-07-03: qty 10

## 2015-07-03 MED ORDER — DEXAMETHASONE SODIUM PHOSPHATE 4 MG/ML IJ SOLN
INTRAMUSCULAR | Status: DC | PRN
  Administered 2015-07-03: 8 mg via INTRAVENOUS

## 2015-07-03 MED ORDER — CHLORHEXIDINE GLUCONATE 4 % EX LIQD: 1.0000 "application " | Freq: Once | CUTANEOUS | Status: DC

## 2015-07-03 MED ORDER — IRBESARTAN 300 MG PO TABS
300.0000 mg | ORAL_TABLET | Freq: Every day | ORAL | Status: DC
  Administered 2015-07-04 – 2015-07-05 (×2): 300 mg via ORAL
  Filled 2015-07-03 (×2): qty 1

## 2015-07-03 MED ORDER — LIDOCAINE HCL (CARDIAC) 20 MG/ML IV SOLN
INTRAVENOUS | Status: AC
  Filled 2015-07-03: qty 5

## 2015-07-03 MED ORDER — MIDAZOLAM HCL 2 MG/2ML IJ SOLN
INTRAMUSCULAR | Status: AC
Start: 2015-07-03 — End: 2015-07-03
  Administered 2015-07-03: 2 mg via INTRAVENOUS
  Filled 2015-07-03: qty 2

## 2015-07-03 MED ORDER — FENTANYL CITRATE (PF) 100 MCG/2ML IJ SOLN
INTRAMUSCULAR | Status: AC
  Administered 2015-07-03: 100 ug via INTRAVENOUS
  Filled 2015-07-03: qty 2

## 2015-07-03 MED ORDER — HYDROCHLOROTHIAZIDE 25 MG PO TABS
25.0000 mg | ORAL_TABLET | Freq: Every day | ORAL | Status: DC
  Filled 2015-07-03: qty 1

## 2015-07-03 MED ORDER — PROPOFOL 10 MG/ML IV BOLUS
INTRAVENOUS | Status: AC
  Filled 2015-07-03: qty 20

## 2015-07-03 MED ORDER — EPHEDRINE SULFATE 50 MG/ML IJ SOLN
INTRAMUSCULAR | Status: DC | PRN
  Administered 2015-07-03 (×4): 5 mg via INTRAVENOUS

## 2015-07-03 MED ORDER — TECHNETIUM TC 99M SULFUR COLLOID FILTERED
1.0000 | Freq: Once | INTRAVENOUS | Status: AC | PRN
  Administered 2015-07-03: 1 via INTRADERMAL

## 2015-07-03 MED ORDER — CEFAZOLIN SODIUM-DEXTROSE 2-3 GM-% IV SOLR
INTRAVENOUS | Status: AC
  Filled 2015-07-03: qty 50

## 2015-07-03 MED ORDER — EZETIMIBE 10 MG PO TABS
10.0000 mg | ORAL_TABLET | Freq: Every day | ORAL | Status: DC
  Administered 2015-07-04 – 2015-07-05 (×2): 10 mg via ORAL
  Filled 2015-07-03 (×2): qty 1

## 2015-07-03 MED ORDER — ONDANSETRON HCL 4 MG/2ML IJ SOLN
4.0000 mg | Freq: Four times a day (QID) | INTRAMUSCULAR | Status: DC | PRN
  Administered 2015-07-04 (×2): 4 mg via INTRAVENOUS
  Filled 2015-07-03 (×2): qty 2

## 2015-07-03 MED ORDER — ALPRAZOLAM 0.5 MG PO TABS: 2.0000 mg | ORAL_TABLET | Freq: Every evening | ORAL | Status: DC | PRN

## 2015-07-03 MED ORDER — ATENOLOL 50 MG PO TABS
50.0000 mg | ORAL_TABLET | Freq: Every day | ORAL | Status: DC
  Filled 2015-07-03: qty 1

## 2015-07-03 MED ORDER — ATENOLOL-CHLORTHALIDONE 50-25 MG PO TABS: 1.0000 | ORAL_TABLET | Freq: Every day | ORAL | Status: DC

## 2015-07-03 MED ORDER — CHLORTHALIDONE 25 MG PO TABS
25.0000 mg | ORAL_TABLET | Freq: Every day | ORAL | Status: DC
  Administered 2015-07-04: 25 mg via ORAL
  Filled 2015-07-03 (×2): qty 1

## 2015-07-03 MED ORDER — ENOXAPARIN SODIUM 40 MG/0.4ML ~~LOC~~ SOLN
40.0000 mg | SUBCUTANEOUS | Status: DC
  Administered 2015-07-04 – 2015-07-05 (×2): 40 mg via SUBCUTANEOUS
  Filled 2015-07-03 (×2): qty 0.4

## 2015-07-03 MED ORDER — ONDANSETRON 4 MG PO TBDP: 4.0000 mg | ORAL_TABLET | Freq: Four times a day (QID) | ORAL | Status: DC | PRN

## 2015-07-03 MED ORDER — MIDAZOLAM HCL 5 MG/5ML IJ SOLN
INTRAMUSCULAR | Status: DC | PRN
  Administered 2015-07-03: 2 mg via INTRAVENOUS

## 2015-07-03 MED ORDER — MIDAZOLAM HCL 2 MG/2ML IJ SOLN
INTRAMUSCULAR | Status: AC
  Filled 2015-07-03: qty 4

## 2015-07-03 MED ORDER — SODIUM CHLORIDE 0.9 % IJ SOLN
INTRAMUSCULAR | Status: AC
  Filled 2015-07-03: qty 10

## 2015-07-03 MED ORDER — SODIUM CHLORIDE 0.9 % IJ SOLN
INTRAMUSCULAR | Status: DC | PRN
  Administered 2015-07-03: 5 mL via SUBCUTANEOUS

## 2015-07-03 MED ORDER — BUPROPION HCL ER (XL) 150 MG PO TB24
300.0000 mg | ORAL_TABLET | Freq: Every morning | ORAL | Status: DC
  Administered 2015-07-04 – 2015-07-05 (×2): 300 mg via ORAL
  Filled 2015-07-03 (×2): qty 2

## 2015-07-03 MED ORDER — POTASSIUM CHLORIDE IN NACL 20-0.45 MEQ/L-% IV SOLN
INTRAVENOUS | Status: DC
  Administered 2015-07-03 – 2015-07-04 (×2): via INTRAVENOUS
  Filled 2015-07-03 (×4): qty 1000

## 2015-07-03 MED ORDER — ONDANSETRON HCL 4 MG/2ML IJ SOLN
INTRAMUSCULAR | Status: DC | PRN
  Administered 2015-07-03: 4 mg via INTRAVENOUS

## 2015-07-03 MED ORDER — HYDROMORPHONE HCL 1 MG/ML IJ SOLN
0.2500 mg | INTRAMUSCULAR | Status: DC | PRN
  Administered 2015-07-03: 0.5 mg via INTRAVENOUS

## 2015-07-03 MED ORDER — PROPOFOL 10 MG/ML IV BOLUS
INTRAVENOUS | Status: DC | PRN
  Administered 2015-07-03: 300 mg via INTRAVENOUS

## 2015-07-03 MED ORDER — MORPHINE SULFATE (PF) 2 MG/ML IV SOLN
2.0000 mg | INTRAVENOUS | Status: DC | PRN
  Administered 2015-07-03 – 2015-07-04 (×4): 4 mg via INTRAVENOUS
  Filled 2015-07-03 (×4): qty 2

## 2015-07-03 MED ORDER — FLUTICASONE PROPIONATE 50 MCG/ACT NA SUSP
1.0000 | Freq: Every day | NASAL | Status: DC
  Filled 2015-07-03: qty 16

## 2015-07-03 MED ORDER — PROMETHAZINE HCL 25 MG/ML IJ SOLN
6.2500 mg | INTRAMUSCULAR | Status: DC | PRN
Start: 2015-07-03 — End: 2015-07-03

## 2015-07-03 MED ORDER — FENTANYL CITRATE (PF) 100 MCG/2ML IJ SOLN
INTRAMUSCULAR | Status: DC | PRN
  Administered 2015-07-03: 25 ug via INTRAVENOUS
  Administered 2015-07-03: 50 ug via INTRAVENOUS
  Administered 2015-07-03: 25 ug via INTRAVENOUS
  Administered 2015-07-03: 50 ug via INTRAVENOUS

## 2015-07-03 MED ORDER — FENTANYL CITRATE (PF) 250 MCG/5ML IJ SOLN
INTRAMUSCULAR | Status: AC
  Filled 2015-07-03: qty 5

## 2015-07-03 MED ORDER — CEFAZOLIN SODIUM-DEXTROSE 2-3 GM-% IV SOLR
2.0000 g | INTRAVENOUS | Status: AC
  Administered 2015-07-03: 2 g via INTRAVENOUS

## 2015-07-03 MED ORDER — HYDROMORPHONE HCL 1 MG/ML IJ SOLN
INTRAMUSCULAR | Status: AC
  Filled 2015-07-03: qty 1

## 2015-07-03 MED ORDER — 0.9 % SODIUM CHLORIDE (POUR BTL) OPTIME
TOPICAL | Status: DC | PRN
  Administered 2015-07-03: 1000 mL

## 2015-07-03 MED ORDER — LIDOCAINE HCL (CARDIAC) 20 MG/ML IV SOLN
INTRAVENOUS | Status: DC | PRN
  Administered 2015-07-03: 100 mg via INTRAVENOUS

## 2015-07-03 MED ORDER — GLYCOPYRROLATE 0.2 MG/ML IJ SOLN
INTRAMUSCULAR | Status: AC
  Filled 2015-07-03: qty 1

## 2015-07-03 MED ORDER — MEPERIDINE HCL 25 MG/ML IJ SOLN: 6.2500 mg | INTRAMUSCULAR | Status: DC | PRN

## 2015-07-03 MED ORDER — LACTATED RINGERS IV SOLN
INTRAVENOUS | Status: DC
  Administered 2015-07-03 (×2): via INTRAVENOUS

## 2015-07-03 MED ORDER — BUPIVACAINE-EPINEPHRINE (PF) 0.5% -1:200000 IJ SOLN
INTRAMUSCULAR | Status: DC | PRN
  Administered 2015-07-03: 50 mL

## 2015-07-03 MED ORDER — OXYCODONE-ACETAMINOPHEN 5-325 MG PO TABS
1.0000 | ORAL_TABLET | ORAL | Status: DC | PRN
  Administered 2015-07-03: 1 via ORAL
  Administered 2015-07-04 – 2015-07-05 (×3): 2 via ORAL
  Filled 2015-07-03 (×3): qty 2
  Filled 2015-07-03: qty 1
  Filled 2015-07-03: qty 2

## 2015-07-03 MED ORDER — DEXAMETHASONE SODIUM PHOSPHATE 4 MG/ML IJ SOLN
INTRAMUSCULAR | Status: AC
  Filled 2015-07-03: qty 2

## 2015-07-03 SURGICAL SUPPLY — 63 items
BINDER BREAST LRG (GAUZE/BANDAGES/DRESSINGS) IMPLANT
BINDER BREAST XLRG (GAUZE/BANDAGES/DRESSINGS) IMPLANT
BINDER BREAST XXLRG (GAUZE/BANDAGES/DRESSINGS) ×4 IMPLANT
BIOPATCH RED 1 DISK 7.0 (GAUZE/BANDAGES/DRESSINGS) IMPLANT
BIOPATCH RED 1IN DISK 7.0MM (GAUZE/BANDAGES/DRESSINGS)
CANISTER SUCTION 2500CC (MISCELLANEOUS) ×8 IMPLANT
CHLORAPREP W/TINT 26ML (MISCELLANEOUS) ×4 IMPLANT
CLIP TI MEDIUM 6 (CLIP) ×4 IMPLANT
CONT SPEC 4OZ CLIKSEAL STRL BL (MISCELLANEOUS) ×8 IMPLANT
COVER PROBE W GEL 5X96 (DRAPES) ×4 IMPLANT
COVER SURGICAL LIGHT HANDLE (MISCELLANEOUS) ×4 IMPLANT
DEVICE DISSECT PLASMABLAD 3.0S (MISCELLANEOUS) ×2 IMPLANT
DRAIN CHANNEL 19F RND (DRAIN) ×8 IMPLANT
DRAPE CHEST BREAST 15X10 FENES (DRAPES) ×4 IMPLANT
DRAPE UTILITY XL STRL (DRAPES) ×8 IMPLANT
DRSG PAD ABDOMINAL 8X10 ST (GAUZE/BANDAGES/DRESSINGS) ×4 IMPLANT
DRSG TEGADERM 2-3/8X2-3/4 SM (GAUZE/BANDAGES/DRESSINGS) ×16 IMPLANT
ELECT CAUTERY BLADE 6.4 (BLADE) ×4 IMPLANT
ELECT REM PT RETURN 9FT ADLT (ELECTROSURGICAL) ×8
ELECTRODE REM PT RTRN 9FT ADLT (ELECTROSURGICAL) ×4 IMPLANT
EVACUATOR SILICONE 100CC (DRAIN) ×8 IMPLANT
GAUZE XEROFORM 5X9 LF (GAUZE/BANDAGES/DRESSINGS) ×8 IMPLANT
GLOVE BIO SURGEON STRL SZ8 (GLOVE) ×4 IMPLANT
GLOVE BIOGEL PI IND STRL 6.5 (GLOVE) ×2 IMPLANT
GLOVE BIOGEL PI IND STRL 7.5 (GLOVE) ×4 IMPLANT
GLOVE BIOGEL PI IND STRL 8 (GLOVE) ×2 IMPLANT
GLOVE BIOGEL PI IND STRL 8.5 (GLOVE) ×2 IMPLANT
GLOVE BIOGEL PI INDICATOR 6.5 (GLOVE) ×2
GLOVE BIOGEL PI INDICATOR 7.5 (GLOVE) ×4
GLOVE BIOGEL PI INDICATOR 8 (GLOVE) ×2
GLOVE BIOGEL PI INDICATOR 8.5 (GLOVE) ×2
GLOVE ECLIPSE 7.5 STRL STRAW (GLOVE) ×8 IMPLANT
GLOVE SURG SS PI 6.5 STRL IVOR (GLOVE) ×4 IMPLANT
GOWN STRL REUS W/ TWL LRG LVL3 (GOWN DISPOSABLE) ×4 IMPLANT
GOWN STRL REUS W/ TWL XL LVL3 (GOWN DISPOSABLE) ×2 IMPLANT
GOWN STRL REUS W/TWL LRG LVL3 (GOWN DISPOSABLE) ×4
GOWN STRL REUS W/TWL XL LVL3 (GOWN DISPOSABLE) ×2
KIT BASIN OR (CUSTOM PROCEDURE TRAY) ×4 IMPLANT
KIT ROOM TURNOVER OR (KITS) ×4 IMPLANT
LIQUID BAND (GAUZE/BANDAGES/DRESSINGS) IMPLANT
NEEDLE 18GX1X1/2 (RX/OR ONLY) (NEEDLE) ×4 IMPLANT
NEEDLE HYPO 25GX1X1/2 BEV (NEEDLE) ×4 IMPLANT
NS IRRIG 1000ML POUR BTL (IV SOLUTION) ×4 IMPLANT
PACK GENERAL/GYN (CUSTOM PROCEDURE TRAY) ×4 IMPLANT
PAD ARMBOARD 7.5X6 YLW CONV (MISCELLANEOUS) ×4 IMPLANT
PLASMABLADE 3.0S (MISCELLANEOUS) ×4
SPECIMEN JAR X LARGE (MISCELLANEOUS) ×4 IMPLANT
SPONGE GAUZE 4X4 12PLY STER LF (GAUZE/BANDAGES/DRESSINGS) ×8 IMPLANT
SPONGE LAP 18X18 X RAY DECT (DISPOSABLE) ×8 IMPLANT
STAPLER VISISTAT 35W (STAPLE) ×8 IMPLANT
SUT ETHILON 2 0 FS 18 (SUTURE) ×4 IMPLANT
SUT MNCRL AB 4-0 PS2 18 (SUTURE) ×4 IMPLANT
SUT MON AB 5-0 PS2 18 (SUTURE) ×4 IMPLANT
SUT SILK 3 0 SH CR/8 (SUTURE) ×4 IMPLANT
SUT VIC AB 3-0 54X BRD REEL (SUTURE) ×2 IMPLANT
SUT VIC AB 3-0 BRD 54 (SUTURE) ×2
SUT VIC AB 3-0 SH 18 (SUTURE) ×16 IMPLANT
SYR CONTROL 10ML LL (SYRINGE) ×4 IMPLANT
TAPE STRIPS DRAPE STRL (GAUZE/BANDAGES/DRESSINGS) ×4 IMPLANT
TOWEL OR 17X24 6PK STRL BLUE (TOWEL DISPOSABLE) ×4 IMPLANT
TOWEL OR 17X26 10 PK STRL BLUE (TOWEL DISPOSABLE) ×4 IMPLANT
TUBE CONNECTING 12'X1/4 (SUCTIONS) ×1
TUBE CONNECTING 12X1/4 (SUCTIONS) ×3 IMPLANT

## 2015-07-03 NOTE — Interval H&P Note (Signed)
History and Physical Interval Note:  07/03/2015 11:37 AM  Jennifer [name]  has presented today for surgery, with the diagnosis of Left breast cancer  The various methods of treatment have been discussed with the patient and family. After consideration of risks, benefits and other options for treatment, the patient has consented to  Procedure(s): LEFT TOTAL  MASTECTOMY WITH LEFT  AXILLARY SENTINEL NODE BIOPSY (Left) RIGHT TOTAL PROPHYLACTIC MASTECTOMY (Right) as a surgical intervention .  The patient's history has been reviewed, patient examined, no change in status, stable for surgery.  I have reviewed the patient's chart and labs.  Questions were answered to the patient's satisfaction.     [name]

## 2015-07-03 NOTE — Transfer of Care (Signed)
Immediate Anesthesia Transfer of Care Note  Patient: Jennifer [name]  Procedure(s) Performed: Procedure(s): LEFT TOTAL  MASTECTOMY WITH LEFT  AXILLARY SENTINEL NODE BIOPSY (Left) RIGHT TOTAL PROPHYLACTIC MASTECTOMY (Right)  Patient Location: PACU  Anesthesia Type:General  Level of Consciousness: sedated  Airway & Oxygen Therapy: Patient Spontanous Breathing and Patient connected to nasal cannula oxygen  Post-op Assessment: Report given to RN and Post -op Vital signs reviewed and stable  Post vital signs: stable  Last Vitals:  Filed Vitals:   07/03/15 1120  BP:   Pulse: 64  Temp:   Resp: 12    Complications: No apparent anesthesia complications

## 2015-07-03 NOTE — Anesthesia Preprocedure Evaluation (Addendum)
Anesthesia Evaluation  Patient identified by MRN, date of birth, ID band Patient awake    Reviewed: Allergy & Precautions, H&P , NPO status , Patient's Chart, lab work & pertinent test results, reviewed documented beta blocker date and time   History of Anesthesia Complications Negative for: history of anesthetic complications  Airway Mallampati: II  TM Distance: >3 FB Neck ROM: Full    Dental no notable dental hx. (+) Teeth Intact   Pulmonary pneumonia -, resolved,  breath sounds clear to auscultation  Pulmonary exam normal       Cardiovascular hypertension, Pt. on medications and Pt. on home beta blockers Normal cardiovascular examRhythm:Regular Rate:Normal     Neuro/Psych PSYCHIATRIC DISORDERS Anxiety Depression    GI/Hepatic GERD-  ,  Endo/Other  diabetes, Well Controlled, Type 2  Renal/GU Renal disease     Musculoskeletal  (+) Arthritis -,   Abdominal (+) + obese,   Peds  Hematology   Anesthesia Other Findings   Reproductive/Obstetrics                             Anesthesia Physical  Anesthesia Plan  ASA: III  Anesthesia Plan: General and Regional   Post-op Pain Management:    Induction: Intravenous  Airway Management Planned: LMA  Additional Equipment:   Intra-op Plan:   Post-operative Plan: Extubation in OR  Informed Consent: I have reviewed the patients History and Physical, chart, labs and discussed the procedure including the risks, benefits and alternatives for the proposed anesthesia with the patient or authorized representative who has indicated his/her understanding and acceptance.   Dental advisory given  Plan Discussed with: CRNA  Anesthesia Plan Comments:        Anesthesia Quick Evaluation

## 2015-07-03 NOTE — Op Note (Signed)
Preoperative Diagnosis: Left breast cancer  Postoprative Diagnosis: Left breast cancer  Procedure: Procedure(s): BLUE DYE INJECTION LEFT BREAST LEFT TOTAL  MASTECTOMY WITH LEFT  AXILLARY SENTINEL NODE BIOPSY RIGHT TOTAL PROPHYLACTIC MASTECTOMY   Surgeon: Excell Seltzer T   Assistants: RNFA  Anesthesia:  General LMA anesthesia  Indications: patient has a recent diagnosis of stage IA cancer of the left breast. We have discussed surgical treatment options extensively and she desires bilateral mastectomy without reconstruction. The alternatives and risks were discussed in detailed elsewhere. We will plan left axillary sentinel lymph node biopsy.    Procedure Detail:  In the holding area prior to surgery under IV sedation 1 mCi of technetium sulfur colloid was injected intradermally around the left nipple. The patient was then brought to the operating room, placed in the supine position on the operating table and laryngeal mask general anesthesia induced. She received preoperative IV antibiotics. Under sterile technique after patient timeout injected 5 mL of dilute methylene blue simultaneously beneath the left nipple and massaged this for several minutes. Following this the entire anterior chest, breasts and axilla and upper arms were widely sterilely prepped and draped. Patient timeout was again performed and correct procedure verified. The prophylactic right side was approached initially. An elliptical somewhat oblique incision encompassing the nipple areolar complex was used. Skin and subcutaneous flaps were then developed with the plasma blade superiorly to just below the clavicle, medially to the edge of the sternum, and finally to the inframammary crease and laterally out to the anterior border latissimus dorsi which was identified and dissected. Following this the breast was reflected up off the chest wall working medial to lateral with the plasma blade clearing the pectoralis major and  serratus muscles and dissecting the specimen off the edge of the pectoralis major and off the anterior latissimus working up toward the axilla. The clavipectoral fascia was incised and the pectoralis minor identified and dissected off the axillary contents. This dissection was carried up to the low axilla at which point I came across the low axilla with Kelly clamps and the specimen was removed and the axillar contents tied with 3-0 Vicryl. The specimen was oriented with a suture. The wound was copiously irrigated with warm saline. Complete hemostasis was obtained. A 19 Blake drain was left through a separate stab wound under both flaps. The subcutaneous stitch was closed with interrupted 3-0 Vicryl. Skin was closed with staples. Following this attention was turned to the left side. An identical incision and dissection was used on the breast. As the dissection was carried up into the low axilla with the neoprobe was used to identify a node with markedly elevated counts and also had bright blue dye. This was excised with the plasma blade and had counts ex vivo of about 1000. It was sent as hot blue axillary lymph node #1. There was still an area of somewhat elevated counts higher in the axilla and using the neoprobe for guidance I dissected down onto a small node which was removed with the plasma blade and ex vivo had counts of about 100 with background in the axilla now essentially 0. There was no palpable adenopathy. This was sent as hot axillary sentinel lymph node #2. At this point I came across low axilla with Kelly clamps and tied as previously and the specimen was removed oriented and sent for permanent pathology. A drain was placed and wound closed in identical fashion to the opposite side.    Findings: As above  Estimated Blood  Loss:  less than 100 mL         Drains: 19 round Blake drain 1 each bilaterally  Blood Given: none          Specimens: #1 right total mastectomy    #2 left total  mastectomy    #3 hot blue left axillary sentinel lymph node    #4 hot left axillary sentinel lymph node        Complications:  * No complications entered in OR log *         Disposition: PACU - hemodynamically stable.         Condition: stable

## 2015-07-03 NOTE — H&P (Signed)
History of Present Illness Jennifer [name] Jennifer [name]; 06/07/2015 2:47 PM) The patient is a 59 year old female who presents with breast cancer. She is a post menopausal female referred by Dr. Isaiah Blakes for evaluation of recently diagnosed carcinoma of the left breast. She recently presented for a screening mamogram revealing a new left breast mass. Subsequent imaging included diagnostic mamogram showing a spiculated left breast mass and ultrasound showing a 1 cm lobulated mass in the left breast at 12:00 9 cm from the nipple. An ultrasound guided breast biopsy was performed on 11/30/2014 with pathology revealing `invasive ductal carcinoma of the breast. She is seen now in multidisciplinary clinic for initial treatment planning. She has experienced no breast symptoms, specifically lump, pain, skin changes or nipple discharge. She does not have a personal history of any previous breast problems.  Findings at that time were the following: Tumor size: 1 cm Tumor grade: 1-2 Estrogen Receptor: Pos Progesterone Receptor: Pos Her-2 neu: Neg Lymph node status: Neg    Other Problems Jennifer [name], CMA; 06/07/2015 8:28 AM) Anxiety Disorder Arthritis Atrial Fibrillation Back Pain Breast Cancer Depression Diabetes Mellitus High blood pressure Hypercholesterolemia Lump In Breast Migraine Headache Myocardial infarction Other disease, cancer, significant illness Sleep Apnea Umbilical Hernia Repair  Past Surgical History Jennifer [name], CMA; 06/07/2015 8:28 AM) Breast Biopsy Left. Foot Surgery Right. Gallbladder Surgery - Laparoscopic Oral Surgery  Diagnostic Studies History Jennifer [name], Jennifer [name]; 06/07/2015 8:28 AM) Colonoscopy 1-5 years ago Mammogram within last year Pap Smear 1-5 years ago  Social History Jennifer [name], Jennifer [name]; 06/07/2015 8:28 AM) Alcohol use Remotely quit alcohol use. Caffeine use Carbonated beverages. No drug use Tobacco use Never  smoker.  Family History Jennifer [name], Jennifer [name]; 06/07/2015 8:28 AM) Alcohol Abuse Father, Mother. Arthritis Family Members In General. Cancer Family Members In General. Cerebrovascular Accident Family Members In General. Colon Polyps Sister. Depression Brother, Family Members In General, Father, Mother, Sister. Diabetes Mellitus Brother, Family Members In [place], Sister. Heart Disease Family Members In General. Hypertension Mother. Melanoma Brother. Migraine Headache Mother. Respiratory Condition Mother.  Pregnancy / Birth History Jennifer [name], Jennifer [name]; 06/07/2015 8:28 AM) Age at menarche 59 years. Age of menopause 51-55 Irregular periods  Review of Systems Jennifer [name] CMA; 06/07/2015 8:28 AM) General Present- Fatigue and Weight Gain. Not Present- Appetite Loss, Chills, Fever, Night Sweats and Weight Loss. Skin Present- Rash. Not Present- Change in Wart/Mole, Dryness, Hives, Jaundice, New Lesions, Non-Healing Wounds and Ulcer. HEENT Present- Hearing Loss, Hoarseness, Oral Ulcers, Seasonal Allergies, Sinus Pain, Wears glasses/contact lenses and Yellow Eyes. Not Present- Earache, Nose Bleed, Ringing in the Ears, Sore Throat and Visual Disturbances. Respiratory Present- Chronic Cough, Snoring and Wheezing. Not Present- Bloody sputum and Difficulty Breathing. Breast Present- Breast Mass. Not Present- Breast Pain, Nipple Discharge and Skin Changes. Cardiovascular Not Present- Chest Pain, Difficulty Breathing Lying Down, Leg Cramps, Palpitations, Rapid Heart Rate, Shortness of Breath and Swelling of Extremities. Gastrointestinal Present- Excessive gas. Not Present- Abdominal Pain, Bloating, Bloody Stool, Change in Bowel Habits, Chronic diarrhea, Constipation, Difficulty Swallowing, Gets full quickly at meals, Hemorrhoids, Indigestion, Nausea, Rectal Pain and Vomiting. Female Genitourinary Not Present- Frequency, Nocturia, Painful Urination, Pelvic Pain and Urgency. Musculoskeletal  Present- Back Pain, Joint Pain, Joint Stiffness, Muscle Pain and Muscle Weakness. Not Present- Swelling of Extremities. Neurological Present- Tingling, Trouble walking and Weakness. Not Present- Decreased Memory, Fainting, Headaches, Numbness, Seizures and Tremor. Psychiatric Present- Anxiety, Change in Sleep Pattern and Fearful. Not Present- Bipolar, Depression and Frequent crying. Endocrine Present- Hot flashes. Not Present- Cold  Intolerance, Excessive Hunger, Hair Changes, Heat Intolerance and New Diabetes. Hematology Present- Easy Bruising and Excessive bleeding. Not Present- Gland problems, HIV and Persistent Infections.   Physical Exam Jennifer [name] T. Carnelius Hammitt [name]; 06/07/2015 2:48 PM) The physical exam findings are as follows: Note:General: Alert, obese Caucasian female, in no distress Skin: Warm and dry without rash or infection. HEENT: No palpable masses or thyromegaly. Sclera nonicteric. Pupils equal round and reactive. Oropharynx clear. Lymph nodes: No cervical, supraclavicular, or inguinal nodes palpable. breasts: Some bruising and thickening of the upper left breast post biopsy. No definite masses in either breast. No palpable axillary adenopathy Lungs: Breath sounds clear and equal. No wheezing or increased work of breathing. Cardiovascular: Regular rate and rhythm without murmer. No JVD or edema. Peripheral pulses intact. No carotid bruits. Abdomen: Nondistended. Soft and nontender. No masses palpable. No organomegaly. No palpable hernias. Extremities: No edema or joint swelling or deformity. No chronic venous stasis changes. Neurologic: Alert and fully oriented. Gait normal. No focal weakness. Psychiatric: Normal mood and affect. Thought content appropriate with normal judgement and insight    Assessment & Plan Jennifer [name] T. Makaela Cando [name]; 06/07/2015 2:53 PM) MALIGNANT NEOPLASM OF UPPER-OUTER QUADRANT OF LEFT FEMALE BREAST (174.4  C50.412) Impression: 59 year old female with a new  diagnosis of cancer of the left breast, upper outer quadrant. Clinical stage 1A, ER pos, PR pos, HER-2 neg. I discussed with the patient and her spouse today initial surgical treatment options. We discussed options of breast conservation with lumpectomy or total mastectomy and sentinal lymph node biopsy/dissection. Options for reconstruction were discussed. After discussion they have elected to proceed with bilateral total mastectomy with left axillary sentinel lymph node biopsy. we discussed extensively that she does not need a masteylactic mastectomy would not influenced that prophylactic mastectomy would not influence her cancer survivor. She has done her homework regarding treatment decisions and understands her decision and reasons well and is clear and comfortable with her decision for bilateral mastectomy. she does not desire reconstruction We discussed the indications and nature of the procedure, and expected recovery, in detail. Surgical risks including anesthetic complications, cardiorespiratory complications, bleeding, infection, wound healing complications, blood clots, lymphedema, local and distant recurrence and possible need for further surgery based on the final pathology was discussed and understood. Chemotherapy, hormonal therapy and radiation therapy have been discussed. They have been provided with literature regarding the treatment of breast cancer. All questions were answered. They understand and agree to proceed and we will go ahead with scheduling. Current Plans  Pt Education - CCS Mastectomy HCI Schedule for Surgery bilateral total mastectomy, left axillary SLN Bx,  under general anesthesia with overnight hospitalization

## 2015-07-03 NOTE — Anesthesia Procedure Notes (Addendum)
Anesthesia Regional Block:  Pectoralis block  Pre-Anesthetic Checklist: ,, timeout performed, Correct Patient, Correct Site, Correct Laterality, Correct Procedure, Correct Position, site marked, Risks and benefits discussed, Surgical consent,  Pre-op evaluation,  Post-op pain management  Laterality: Right and Left  Prep: chloraprep       Needles:   Needle Type: Stimiplex     Needle Length: 9cm 9 cm     Additional Needles:  Procedures: ultrasound guided (picture in chart) Pectoralis block Narrative:  Injection made incrementally with aspirations every 5 mL.  Performed by: Personally  Anesthesiologist: Nolon Nations  Additional Notes: Patient tolerated well. Good fascial spread noted.   Procedure Name: LMA Insertion Date/Time: 07/03/2015 11:53 AM Performed by: [place] Pink Pre-anesthesia Checklist: Patient identified, Emergency Drugs available, Suction available, Patient being monitored and Timeout performed Patient Re-evaluated:Patient Re-evaluated prior to inductionOxygen Delivery Method: Circle system utilized Preoxygenation: Pre-oxygenation with 100% oxygen Intubation Type: IV induction LMA: LMA inserted LMA Size: 4.0 Number of attempts: 1 Placement Confirmation: positive ETCO2 and breath sounds checked- equal and bilateral Tube secured with: Tape Dental Injury: Teeth and Oropharynx as per pre-operative assessment

## 2015-07-04 ENCOUNTER — Encounter (HOSPITAL_COMMUNITY): Payer: Self-pay | Admitting: General Practice

## 2015-07-04 DIAGNOSIS — I252 Old myocardial infarction: Secondary | ICD-10-CM | POA: Diagnosis not present

## 2015-07-04 DIAGNOSIS — C50912 Malignant neoplasm of unspecified site of left female breast: Secondary | ICD-10-CM | POA: Diagnosis present

## 2015-07-04 DIAGNOSIS — F329 Major depressive disorder, single episode, unspecified: Secondary | ICD-10-CM | POA: Diagnosis present

## 2015-07-04 DIAGNOSIS — E118 Type 2 diabetes mellitus with unspecified complications: Secondary | ICD-10-CM | POA: Diagnosis present

## 2015-07-04 DIAGNOSIS — Z170 Estrogen receptor positive status [ER+]: Secondary | ICD-10-CM | POA: Diagnosis not present

## 2015-07-04 DIAGNOSIS — E780 Pure hypercholesterolemia: Secondary | ICD-10-CM | POA: Diagnosis present

## 2015-07-04 DIAGNOSIS — F419 Anxiety disorder, unspecified: Secondary | ICD-10-CM | POA: Diagnosis present

## 2015-07-04 DIAGNOSIS — C50412 Malignant neoplasm of upper-outer quadrant of left female breast: Secondary | ICD-10-CM | POA: Diagnosis present

## 2015-07-04 DIAGNOSIS — I10 Essential (primary) hypertension: Secondary | ICD-10-CM | POA: Diagnosis present

## 2015-07-04 DIAGNOSIS — Z4001 Encounter for prophylactic removal of breast: Secondary | ICD-10-CM | POA: Diagnosis present

## 2015-07-04 DIAGNOSIS — E876 Hypokalemia: Secondary | ICD-10-CM | POA: Diagnosis present

## 2015-07-04 LAB — CBC
HCT: 38 % (ref 36.0–46.0)
Hemoglobin: 12.6 g/dL (ref 12.0–15.0)
MCH: 31.1 pg (ref 26.0–34.0)
MCHC: 33.2 g/dL (ref 30.0–36.0)
MCV: 93.8 fL (ref 78.0–100.0)
PLATELETS: 237 10*3/uL (ref 150–400)
RBC: 4.05 MIL/uL (ref 3.87–5.11)
RDW: 13.8 % (ref 11.5–15.5)
WBC: 15 10*3/uL — ABNORMAL HIGH (ref 4.0–10.5)

## 2015-07-04 LAB — BASIC METABOLIC PANEL
Anion gap: 12 (ref 5–15)
BUN: 18 mg/dL (ref 6–20)
CO2: 22 mmol/L (ref 22–32)
CREATININE: 1.06 mg/dL — AB (ref 0.44–1.00)
Calcium: 8.3 mg/dL — ABNORMAL LOW (ref 8.9–10.3)
Chloride: 101 mmol/L (ref 101–111)
GFR calc Af Amer: >60 mL/min (ref >60)
GFR, EST NON AFRICAN AMERICAN: 57 mL/min — AB (ref >60)
GLUCOSE: 174 mg/dL — AB (ref 65–99)
Potassium: 3.9 mmol/L (ref 3.5–5.1)
SODIUM: 135 mmol/L (ref 135–145)

## 2015-07-04 LAB — HEMOGLOBIN AND HEMATOCRIT, BLOOD
HCT: 35.3 % — ABNORMAL LOW (ref 36.0–46.0)
Hemoglobin: 11.8 g/dL — ABNORMAL LOW (ref 12.0–15.0)

## 2015-07-04 MED ORDER — OXYCODONE-ACETAMINOPHEN 5-325 MG PO TABS: 1.0000 | ORAL_TABLET | ORAL | Status: DC | PRN

## 2015-07-04 MED ORDER — MENTHOL 3 MG MT LOZG
1.0000 | LOZENGE | OROMUCOSAL | Status: DC | PRN
  Filled 2015-07-04: qty 9

## 2015-07-04 NOTE — Progress Notes (Signed)
Patient ID: Jennifer [name], female   DOB: [birth year], 59 y.o.   MRN: [id number] 1 Day Post-Op  Subjective: Very sore under arms. No major complaints.  Objective: Vital signs in last 24 hours: Temp:  [97.7 F (36.5 C)-98.2 F (36.8 C)] 98 F (36.7 C) (08/23 0441) Pulse Rate:  [59-86] 75 (08/23 0441) Resp:  [9-26] 18 (08/23 0441) BP: (102-166)/(46-78) 137/64 mmHg (08/23 0441) SpO2:  [93 %-99 %] 96 % (08/23 0441) Weight:  [108.274 kg (238 lb 11.2 oz)] 108.274 kg (238 lb 11.2 oz) (08/22 0953) Last BM Date:  (PTA)  Intake/Output from previous day: 08/22 0701 - 08/23 0700 In: 3287.5 [P.O.:920; I.V.:2297.5] Out: 1115 [Urine:750; Drains:165; Blood:200] Intake/Output this shift: Total I/O In: 1347.5 [P.O.:680; I.V.:597.5; Other:70] Out: 86 [Urine:500; Drains:55]  General appearance: alert, cooperative and no distress Resp: clear to auscultation bilaterally Incision/Wound: fflaps looked healthy without hematoma or swelling. JP drainage serosanguineous in appropriate amounts  Lab Results:   Recent Labs  07/04/15 0454  WBC 15.0*  HGB 12.6  HCT 38.0  PLT 237   BMET  Recent Labs  07/04/15 0454  NA 135  K 3.9  CL 101  CO2 22  GLUCOSE 174*  BUN 18  CREATININE 1.06*  CALCIUM 8.3*     Studies/Results: Nm Sentinel Node Inj-no Rpt (breast)  07/03/2015   CLINICAL DATA: cancer left breast   Sulfur colloid was injected intradermally by the nuclear medicine  technologist for breast cancer sentinel node localization.     Anti-infectives: Anti-infectives    Start     Dose/Rate Route Frequency Ordered Stop   07/03/15 0945  ceFAZolin (ANCEF) IVPB 2 g/50 mL premix     2 g 100 mL/hr over 30 Minutes Intravenous On call to O.R. 07/03/15 0935 07/03/15 1158      Assessment/Plan: s/p Procedure(s): LEFT TOTAL  MASTECTOMY WITH LEFT  AXILLARY SENTINEL NODE BIOPSY RIGHT TOTAL PROPHYLACTIC MASTECTOMY Doing well without apparent complication. Okay for discharge today.       Yenty Bloch T 07/04/2015

## 2015-07-04 NOTE — Discharge Summary (Signed)
   Patient ID: Jennifer [name] [id number] 59 y.o. [birth year]  07/03/2015  Discharge date and time: 07/04/2015   Admitting Physician: Excell Seltzer T  Discharge Physician: Excell Seltzer T  Admission Diagnoses: Left breast cancer  Discharge Diagnoses: same  Operations: Procedure(s): LEFT TOTAL  MASTECTOMY WITH LEFT  AXILLARY SENTINEL NODE BIOPSY RIGHT TOTAL PROPHYLACTIC MASTECTOMY  Admission Condition: good  Discharged Condition: good  Indication for Admission: patient has a recent diagnosis of primary cancer of the left breast. After extensive discussion regarding surgical options she desires to have total mastectomy with contralateral prophylactic mastectomy. She is admitted for these procedures.  Hospital Course: patient underwent an uneventful left total mastectomy with axillary sentinel lymph node biopsy and prophylactic right mastectomy. The first morning postoperatively she has some expected discomfort but no major complaints. Wounds look fine without bleeding or other complication. Vital signs are stable hemoglobin is normal. She is felt ready for discharge.  Disposition: Home  Patient Instructions:    Medication List    TAKE these medications        alprazolam 2 MG tablet  Commonly known as:  XANAX  TAKE 1 TABLET BY MOUTH EVERY NIGHT AT BEDTIME AS NEEDED FOR SLEEP     aspirin EC 325 MG tablet  Take 325 mg by mouth daily.     atenolol-chlorthalidone 50-25 MG per tablet  Commonly known as:  TENORETIC  TAKE 1 TABLET BY MOUTH EVERY DAY     BENICAR 40 MG tablet  Generic drug:  olmesartan  TAKE ONE TABLET EACH DAY     buPROPion 300 MG 24 hr tablet  Commonly known as:  WELLBUTRIN XL  TAKE 1 TABLET BY MOUTH EVERY MORNING     ezetimibe 10 MG tablet  Commonly known as:  ZETIA  Take 10 mg by mouth daily.     fluticasone 50 MCG/ACT nasal spray  Commonly known as:  FLONASE  Place 1 spray into both nostrils daily.     oxyCODONE-acetaminophen 5-325 MG per  tablet  Commonly known as:  PERCOCET/ROXICET  Take 1-2 tablets by mouth every 4 (four) hours as needed for moderate pain.        Activity: activity as tolerated Diet: regular diet Wound Care: instructed to empty JP drains twice daily.  Follow-up:  With Dr. Excell Seltzer in 1 week.  Signed: Edward Jolly MD, FACS  07/04/2015, 6:45 AM

## 2015-07-04 NOTE — Discharge Instructions (Signed)
[facility] °[phone] ° °MASTECTOMY: POST OP INSTRUCTIONS ° °Always review your discharge instruction sheet given to you by the facility where your surgery was performed. °IF YOU HAVE DISABILITY OR FAMILY LEAVE FORMS, YOU MUST BRING THEM TO THE OFFICE FOR PROCESSING.   °DO NOT GIVE THEM TO YOUR DOCTOR. °A prescription for pain medication may be given to you upon discharge.  Take your pain medication as prescribed, if needed.  If narcotic pain medicine is not needed, then you may take acetaminophen (Tylenol) or ibuprofen (Advil) as needed. °1. Take your usually prescribed medications unless otherwise directed. °2. If you need a refill on your pain medication, please contact your pharmacy.  They will contact our office to request authorization.  Prescriptions will not be filled after 5pm or on week-ends. °3. You should follow a light diet the first few days after arrival home, such as soup and crackers, etc.  Resume your normal diet the day after surgery. °4. Most patients will experience some swelling and bruising on the chest and underarm.  Ice packs will help.  Swelling and bruising can take several days to resolve.  °5. It is common to experience some constipation if taking pain medication after surgery.  Increasing fluid intake and taking a stool softener (such as Colace) will usually help or prevent this problem from occurring.  A mild laxative (Milk of Magnesia or Miralax) should be taken according to package instructions if there are no bowel movements after 48 hours. °6. Unless discharge instructions indicate otherwise, leave your bandage dry and in place until your next appointment in 3-5 days.  You may take a limited sponge bath.  No tube baths or showers until the drains are removed.  You may have steri-strips (small skin tapes) in place directly over the incision.  These strips should be left on the skin for 7-10 days.  If your surgeon used skin glue on the incision, you may  shower in 24 hours.  The glue will flake off over the next 2-3 weeks.  Any sutures or staples will be removed at the office during your follow-up visit. °7. DRAINS:  If you have drains in place, it is important to keep a list of the amount of drainage produced each day in your drains.  Before leaving the hospital, you should be instructed on drain care.  Call our office if you have any questions about your drains. °8. ACTIVITIES:  You may resume regular (light) daily activities beginning the next day--such as daily self-care, walking, climbing stairs--gradually increasing activities as tolerated.  You may have sexual intercourse when it is comfortable.  Refrain from any heavy lifting or straining until approved by your doctor. °a. You may drive when you are no longer taking prescription pain medication, you can comfortably wear a seatbelt, and you can safely maneuver your car and apply brakes. °b. RETURN TO WORK:  __________________________________________________________ °9. You should see your doctor in the office for a follow-up appointment approximately 3-5 days after your surgery.  Your doctor’s nurse will typically make your follow-up appointment when she calls you with your pathology report.  Expect your pathology report 2-3 business days after your surgery.  You may call to check if you do not hear from us after three days.   °10. OTHER INSTRUCTIONS: ______________________________________________________________________________________________ ____________________________________________________________________________________________ °WHEN TO CALL YOUR DOCTOR: °1. Fever over 101.0 °2. Nausea and/or vomiting °3. Extreme swelling or bruising °4. Continued bleeding from incision. °5. Increased pain, redness, or drainage from the incision. °  The clinic staff is available to answer your questions during regular business hours.  Please don’t hesitate to call and ask to speak to one of the nurses for clinical  concerns.  If you have a medical emergency, go to the nearest emergency room or call 911.  A surgeon from [facility] is always on call at the hospital. °[address], [address], [place], [place]  [address] ? [address] [address], [place], [place]   [address] °[phone] ? [phone] ? FAX [phone] °Web site: [email or web address] °

## 2015-07-05 LAB — CBC
HEMATOCRIT: 33.3 % — AB (ref 36.0–46.0)
HEMOGLOBIN: 10.7 g/dL — AB (ref 12.0–15.0)
MCH: 30.1 pg (ref 26.0–34.0)
MCHC: 32.1 g/dL (ref 30.0–36.0)
MCV: 93.5 fL (ref 78.0–100.0)
PLATELETS: 265 10*3/uL (ref 150–400)
RBC: 3.56 MIL/uL — AB (ref 3.87–5.11)
RDW: 14.1 % (ref 11.5–15.5)
WBC: 11.2 10*3/uL — ABNORMAL HIGH (ref 4.0–10.5)

## 2015-07-05 LAB — BASIC METABOLIC PANEL
ANION GAP: 8 (ref 5–15)
BUN: 19 mg/dL (ref 6–20)
CALCIUM: 8.1 mg/dL — AB (ref 8.9–10.3)
CO2: 28 mmol/L (ref 22–32)
CREATININE: 1.28 mg/dL — AB (ref 0.44–1.00)
Chloride: 99 mmol/L — ABNORMAL LOW (ref 101–111)
GFR, EST AFRICAN AMERICAN: 52 mL/min — AB (ref >60)
GFR, EST NON AFRICAN AMERICAN: 45 mL/min — AB (ref >60)
Glucose, Bld: 182 mg/dL — ABNORMAL HIGH (ref 65–99)
Potassium: 3.2 mmol/L — ABNORMAL LOW (ref 3.5–5.1)
SODIUM: 135 mmol/L (ref 135–145)

## 2015-07-05 MED ORDER — SODIUM CHLORIDE 0.9 % IV BOLUS (SEPSIS)
250.0000 mL | Freq: Once | INTRAVENOUS | Status: AC
  Administered 2015-07-05: 250 mL via INTRAVENOUS

## 2015-07-05 MED ORDER — POTASSIUM CHLORIDE ER 10 MEQ PO TBCR: 10.0000 meq | EXTENDED_RELEASE_TABLET | Freq: Every day | ORAL | Status: DC

## 2015-07-05 MED ORDER — POTASSIUM CHLORIDE 10 MEQ/100ML IV SOLN
10.0000 meq | INTRAVENOUS | Status: AC
  Administered 2015-07-05 (×2): 10 meq via INTRAVENOUS
  Filled 2015-07-05: qty 100

## 2015-07-05 MED ORDER — POTASSIUM CHLORIDE 20 MEQ/15ML (10%) PO SOLN
20.0000 meq | Freq: Once | ORAL | Status: AC
  Administered 2015-07-05: 20 meq via ORAL
  Filled 2015-07-05: qty 15

## 2015-07-05 NOTE — Care Management Note (Signed)
Case Management Note  Patient Details  Name: Jennifer [name] MRN: [id number] Date of Birth: 1956-07-13  Subjective/Objective:  59 y.o. F admitted for L  TOTAL MASTECTOMY WITH LEFT AXILLARY SENTINEL NODE BIOPSY and R TOTAL PROPHYLACTIC MASTECTOMY for Primary Cancer L breast. Will be discharged home with no needs. Pleasant and appreciative of hospital staff assistance. Verbalized understanding of need to use clean technique when emptying drains and understands she can call [facility] staff if needed.                Action/Plan: Will sign off.    Expected Discharge Date:                  Expected Discharge Plan:  Home/Self Care  In-House Referral:     Discharge planning Services  CM Consult  Post Acute Care Choice:    Choice offered to:     DME Arranged:    DME Agency:     HH Arranged:    [place] Agency:     Status of Service:  Completed, signed off  Medicare Important Message Given:    Date Medicare IM Given:    Medicare IM give by:    Date Additional Medicare IM Given:    Additional Medicare Important Message give by:     If discussed at [place] of Stay Meetings, dates discussed:    Additional Comments:  Delrae Sawyers, RN 07/05/2015, 11:45 AM

## 2015-07-05 NOTE — Progress Notes (Signed)
[facility] discharge instructions were reviewed with patient. Patient was given prescription percocet to take to her pharmacy. Patient was also given written instructions on how to care for her JP drains and also demostrated how to empty and measure output from drain. Patient stated that she felt comfortable draining JP. Staff assisted her to her transportaion.

## 2015-07-05 NOTE — Anesthesia Postprocedure Evaluation (Signed)
Anesthesia Post Note  Patient: Jennifer [name]  Procedure(s) Performed: Procedure(s) (LRB): LEFT TOTAL  MASTECTOMY WITH LEFT  AXILLARY SENTINEL NODE BIOPSY (Left) RIGHT TOTAL PROPHYLACTIC MASTECTOMY (Right)  Anesthesia type: General  Patient location: PACU  Post pain: Pain level controlled  Post assessment: Post-op Vital signs reviewed  Last Vitals: BP 141/63 mmHg  Pulse 78  Temp(Src) 36.9 C (Oral)  Resp 16  Ht 5\' 6"  (1.676 m)  Wt 238 lb 11.2 oz (108.274 kg)  BMI 38.55 kg/m2  SpO2 99%  Post vital signs: Reviewed  Level of consciousness: sedated  Complications: No apparent anesthesia complications

## 2015-07-05 NOTE — Progress Notes (Signed)
Patient ID: Jennifer [name], female   DOB: [birth year], 59 y.o.   MRN: [id number] 2 Days Post-Op  Subjective: Feels much better today. Up and walking without difficulty. Wants to go home.  Objective: Vital signs in last 24 hours: Temp:  [98.1 F (36.7 C)-98.6 F (37 C)] 98.5 F (36.9 C) (08/24 0508) Pulse Rate:  [69-78] 78 (08/24 0508) Resp:  [16-18] 16 (08/24 0508) BP: (128-141)/(52-64) 141/63 mmHg (08/24 0508) SpO2:  [92 %-99 %] 99 % (08/24 0508) Last BM Date:  (PTA)  Intake/Output from previous day: 08/23 0701 - 08/24 0700 In: 960 [P.O.:960] Out: 390 [Drains:390] Intake/Output this shift:    General appearance: alert, cooperative and no distress Incision/Wound: incisions clean and dry. No bleeding or hematoma or ischemia. JP drainage is appropriate.  Lab Results:   Recent Labs  07/04/15 0454 07/04/15 1406 07/05/15 0348  WBC 15.0*  --  11.2*  HGB 12.6 11.8* 10.7*  HCT 38.0 35.3* 33.3*  PLT 237  --  265   BMET  Recent Labs  07/04/15 0454 07/05/15 0348  NA 135 135  K 3.9 3.2*  CL 101 99*  CO2 22 28  GLUCOSE 174* 182*  BUN 18 19  CREATININE 1.06* 1.28*  CALCIUM 8.3* 8.1*     Studies/Results: Nm Sentinel Node Inj-no Rpt (breast)  07/03/2015   CLINICAL DATA: cancer left breast   Sulfur colloid was injected intradermally by the nuclear medicine  technologist for breast cancer sentinel node localization.     Anti-infectives: Anti-infectives    Start     Dose/Rate Route Frequency Ordered Stop   07/03/15 0945  ceFAZolin (ANCEF) IVPB 2 g/50 mL premix     2 g 100 mL/hr over 30 Minutes Intravenous On call to O.R. 07/03/15 0935 07/03/15 1158      Assessment/Plan: s/p Procedure(s): LEFT TOTAL  MASTECTOMY WITH LEFT  AXILLARY SENTINEL NODE BIOPSY RIGHT TOTAL PROPHYLACTIC MASTECTOMY Doing well today without apparent complication. Hypokalemia. Replace potassium prior to discharge and will give her a short course of oral potassium after discharge Mild increased  creatinine. This is within range of previous preop lab work. Will give small fluid bolus prior to discharge and she is encouraged to remain hydrated. I believe she is stable for discharge.    LOS: 1 day    Cassian Torelli T 07/05/2015

## 2015-07-05 NOTE — Plan of Care (Signed)
Problem: Discharge Progression Outcomes Goal: Tubes and drains discontinued if indicated Outcome: Not Met (add Reason) Pt to go home with bilateral JP drains    Goal: Staples/sutures removed Outcome: Not Met (add Reason) Staples may be taken out outpatient

## 2015-07-06 ENCOUNTER — Telehealth: Payer: Self-pay | Admitting: *Deleted

## 2015-07-06 NOTE — Telephone Encounter (Signed)
Received order for oncotype testing per Dr. Jana Hakim. Requisition sent to pathology. Received by Alyse Low. PAC sent to [facility]

## 2015-07-07 ENCOUNTER — Ambulatory Visit: Payer: BLUE CROSS/BLUE SHIELD | Admitting: Oncology

## 2015-07-07 ENCOUNTER — Other Ambulatory Visit: Payer: BLUE CROSS/BLUE SHIELD

## 2015-07-14 ENCOUNTER — Encounter (HOSPITAL_COMMUNITY): Payer: Self-pay

## 2015-07-18 ENCOUNTER — Other Ambulatory Visit: Payer: Self-pay | Admitting: Family Medicine

## 2015-07-18 ENCOUNTER — Encounter: Payer: Self-pay | Admitting: *Deleted

## 2015-07-18 NOTE — Progress Notes (Signed)
Oncotype score of 21/14% received. Copy to medical records to scan and copy to Dr. Jana Hakim.

## 2015-07-20 ENCOUNTER — Telehealth: Payer: Self-pay | Admitting: Family Medicine

## 2015-07-20 NOTE — Telephone Encounter (Signed)
Called and left message notifying patient that fasting lab order has been faxed to requested location

## 2015-07-20 NOTE — Telephone Encounter (Signed)
PLEASE FAX LAB ORDER TO [facility] [name] AT FAX [phone] BEFORE HER APPOINTMENT ON Monday WITH  DR Berniece Andreas ADVISE?

## 2015-07-24 ENCOUNTER — Telehealth: Payer: Self-pay | Admitting: *Deleted

## 2015-07-24 ENCOUNTER — Ambulatory Visit: Payer: BLUE CROSS/BLUE SHIELD | Admitting: Family Medicine

## 2015-07-24 ENCOUNTER — Other Ambulatory Visit: Payer: BLUE CROSS/BLUE SHIELD

## 2015-07-24 ENCOUNTER — Ambulatory Visit: Payer: BLUE CROSS/BLUE SHIELD | Admitting: Oncology

## 2015-07-24 NOTE — Telephone Encounter (Signed)
Called pt concerning post op with Dr. Jana Hakim. Relate she called earlier to cancel her appt d/t having "flu like symptoms". Pt relate she will call back in a couple of days to r/s appt with Dr. Jana Hakim.  Pt has contact information to call back and r/s appt.

## 2015-08-17 ENCOUNTER — Other Ambulatory Visit: Payer: Self-pay | Admitting: *Deleted

## 2015-08-17 ENCOUNTER — Telehealth: Payer: Self-pay | Admitting: Oncology

## 2015-08-17 NOTE — Telephone Encounter (Signed)
Called and left a message with new appointment per  Felicity Coyer

## 2015-08-23 ENCOUNTER — Other Ambulatory Visit: Payer: Self-pay

## 2015-08-23 DIAGNOSIS — C50412 Malignant neoplasm of upper-outer quadrant of left female breast: Secondary | ICD-10-CM

## 2015-08-24 ENCOUNTER — Other Ambulatory Visit (HOSPITAL_BASED_OUTPATIENT_CLINIC_OR_DEPARTMENT_OTHER): Payer: BLUE CROSS/BLUE SHIELD

## 2015-08-24 ENCOUNTER — Ambulatory Visit (HOSPITAL_BASED_OUTPATIENT_CLINIC_OR_DEPARTMENT_OTHER): Payer: BLUE CROSS/BLUE SHIELD | Admitting: Oncology

## 2015-08-24 ENCOUNTER — Other Ambulatory Visit: Payer: Self-pay | Admitting: Oncology

## 2015-08-24 VITALS — BP 204/95 | HR 86 | Temp 98.1°F | Resp 20 | Ht 66.0 in | Wt 219.8 lb

## 2015-08-24 DIAGNOSIS — D539 Nutritional anemia, unspecified: Secondary | ICD-10-CM

## 2015-08-24 DIAGNOSIS — C50412 Malignant neoplasm of upper-outer quadrant of left female breast: Secondary | ICD-10-CM

## 2015-08-24 DIAGNOSIS — Z170 Estrogen receptor positive status [ER+]: Secondary | ICD-10-CM

## 2015-08-24 LAB — CBC WITH DIFFERENTIAL/PLATELET
BASO%: 0.7 % (ref 0.0–2.0)
Basophils Absolute: 0.1 10*3/uL (ref 0.0–0.1)
EOS ABS: 0.2 10*3/uL (ref 0.0–0.5)
EOS%: 2 % (ref 0.0–7.0)
HCT: 42.3 % (ref 34.8–46.6)
HEMOGLOBIN: 13.7 g/dL (ref 11.6–15.9)
LYMPH%: 30.9 % (ref 14.0–49.7)
MCH: 30.6 pg (ref 25.1–34.0)
MCHC: 32.4 g/dL (ref 31.5–36.0)
MCV: 94.6 fL (ref 79.5–101.0)
MONO#: 0.5 10*3/uL (ref 0.1–0.9)
MONO%: 6.4 % (ref 0.0–14.0)
NEUT%: 60 % (ref 38.4–76.8)
NEUTROS ABS: 4.6 10*3/uL (ref 1.5–6.5)
PLATELETS: 311 10*3/uL (ref 145–400)
RBC: 4.47 10*6/uL (ref 3.70–5.45)
RDW: 13.6 % (ref 11.2–14.5)
WBC: 7.7 10*3/uL (ref 3.9–10.3)
lymph#: 2.4 10*3/uL (ref 0.9–3.3)

## 2015-08-24 LAB — COMPREHENSIVE METABOLIC PANEL (CC13)
ALT: 64 U/L — AB (ref 0–55)
ANION GAP: 9 meq/L (ref 3–11)
AST: 36 U/L — AB (ref 5–34)
Albumin: 3.8 g/dL (ref 3.5–5.0)
Alkaline Phosphatase: 110 U/L (ref 40–150)
BUN: 13.9 mg/dL (ref 7.0–26.0)
CALCIUM: 9.3 mg/dL (ref 8.4–10.4)
CHLORIDE: 107 meq/L (ref 98–109)
CO2: 24 meq/L (ref 22–29)
CREATININE: 0.9 mg/dL (ref 0.6–1.1)
EGFR: 67 mL/min/{1.73_m2} — ABNORMAL LOW (ref >90)
Glucose: 147 mg/dl — ABNORMAL HIGH (ref 70–140)
Potassium: 3.5 mEq/L (ref 3.5–5.1)
Sodium: 140 mEq/L (ref 136–145)
TOTAL PROTEIN: 7.6 g/dL (ref 6.4–8.3)
Total Bilirubin: 0.43 mg/dL (ref 0.20–1.20)

## 2015-08-24 LAB — VITAMIN B12: VITAMIN B 12: 891 pg/mL (ref 211–911)

## 2015-08-24 LAB — TSH CHCC: TSH: 1.428 m[IU]/L (ref 0.308–3.960)

## 2015-08-24 MED ORDER — ANASTROZOLE 1 MG PO TABS: 1.0000 mg | ORAL_TABLET | Freq: Every day | ORAL | Status: DC
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
## 2015-08-25 ENCOUNTER — Telehealth: Payer: Self-pay | Admitting: Oncology

## 2015-08-25 LAB — VITAMIN D 25 HYDROXY (VIT D DEFICIENCY, FRACTURES): Vit D, 25-Hydroxy: 22 ng/mL — ABNORMAL LOW (ref 30–100)

## 2015-08-25 LAB — FOLATE RBC: RBC Folate: 740 ng/mL (ref >280)

## 2015-08-25 NOTE — Telephone Encounter (Signed)
vm not set-up....mailed pt NOV and DEC cal

## 2015-08-29 ENCOUNTER — Other Ambulatory Visit: Payer: Self-pay | Admitting: Family Medicine

## 2015-09-04 ENCOUNTER — Encounter: Payer: Self-pay | Admitting: Family Medicine

## 2015-09-06 ENCOUNTER — Telehealth: Payer: Self-pay | Admitting: *Deleted

## 2015-09-06 MED ORDER — GABAPENTIN 300 MG PO CAPS: 300.0000 mg | ORAL_CAPSULE | Freq: Four times a day (QID) | ORAL | Status: DC | PRN

## 2015-09-06 NOTE — Telephone Encounter (Signed)
Pt called c/o nerve pain and pain across the chest at the mastectomy site. Pt relate it is intermittent. Spoke to [employer], PT about pt complaints. She called pt to discuss symptoms and need for PT. Discussed with Dr. Jana Hakim. Called in script for gabapentin. Gave pt instructions and directions. Denies further needs at this time. Encourage pt to call with questions or concerns.

## 2015-09-12 ENCOUNTER — Telehealth: Payer: Self-pay | Admitting: Family Medicine

## 2015-09-12 NOTE — Telephone Encounter (Signed)
LM explaining virtual visits per Dr. Moshe Cipro request. Also sent a mychart message

## 2015-10-02 ENCOUNTER — Telehealth: Payer: Self-pay | Admitting: Family Medicine

## 2015-10-03 ENCOUNTER — Other Ambulatory Visit: Payer: Self-pay

## 2015-10-03 ENCOUNTER — Telehealth: Payer: Self-pay | Admitting: Oncology

## 2015-10-03 NOTE — Telephone Encounter (Signed)
Opened in Error.

## 2015-10-03 NOTE — Telephone Encounter (Signed)
Called patient back and left a voicemail with a new appointment per her request

## 2015-10-04 ENCOUNTER — Telehealth: Payer: Self-pay | Admitting: *Deleted

## 2015-10-04 NOTE — Telephone Encounter (Signed)
Confirmed pt appt to see Dr. Jana Hakim on 11/20/15 at 1030.

## 2015-10-09 ENCOUNTER — Other Ambulatory Visit: Payer: BLUE CROSS/BLUE SHIELD

## 2015-10-09 ENCOUNTER — Other Ambulatory Visit: Payer: Self-pay | Admitting: Family Medicine

## 2015-10-12 ENCOUNTER — Ambulatory Visit: Payer: BLUE CROSS/BLUE SHIELD | Admitting: Oncology

## 2015-10-16 ENCOUNTER — Encounter: Payer: Self-pay | Admitting: Family Medicine

## 2015-10-16 ENCOUNTER — Encounter: Payer: Self-pay | Admitting: *Deleted

## 2015-10-17 ENCOUNTER — Telehealth: Payer: Self-pay

## 2015-10-17 DIAGNOSIS — E1121 Type 2 diabetes mellitus with diabetic nephropathy: Secondary | ICD-10-CM

## 2015-10-17 DIAGNOSIS — E785 Hyperlipidemia, unspecified: Secondary | ICD-10-CM

## 2015-10-17 NOTE — Telephone Encounter (Signed)
Labs ordered and mailed to patient per [facility] request

## 2015-10-24 ENCOUNTER — Other Ambulatory Visit: Payer: Self-pay | Admitting: Family Medicine

## 2015-10-31 ENCOUNTER — Other Ambulatory Visit: Payer: Self-pay | Admitting: Family Medicine

## 2015-11-03 ENCOUNTER — Telehealth: Payer: Self-pay

## 2015-11-03 ENCOUNTER — Telehealth: Payer: Self-pay | Admitting: *Deleted

## 2015-11-03 ENCOUNTER — Other Ambulatory Visit: Payer: Self-pay

## 2015-11-03 MED ORDER — OSELTAMIVIR PHOSPHATE 75 MG PO CAPS: 75.0000 mg | ORAL_CAPSULE | Freq: Two times a day (BID) | ORAL | Status: DC

## 2015-11-03 NOTE — Telephone Encounter (Signed)
Tammi Flu has been called in at 10:08 am by PCP and patient notified by PCP office.

## 2015-11-03 NOTE — Telephone Encounter (Signed)
"   I have a fever and I know I have the flu because I'm shaking, my body aches, everything hurts, the shaking makes my incision hurt.  Temp = 101 degrees.  Symptoms and fever started last night.  I've taken aspirin for my temperature "  Advised she take ibuprofen but says she "takes ibuprofen for something else, I'm supposed to take it every four hours as needed, it doesn't work" so she took aspirin.  Will notify provider.  Has called PCP also today.

## 2015-11-03 NOTE — Telephone Encounter (Signed)
Per dr Moshe Cipro, tamiflu called in and patient aware

## 2015-11-03 NOTE — Telephone Encounter (Signed)
Please see message below in bold  Acute onset of fever Yes.   has been staying around 101 since yesterday Headache Yes.   Body ache Yes.  aching bad all over with terrible chills  Fatigue Yes.   Non productive cough No. Sore throat No. Nasal discharge No.  Onset between 1-4 days of possible exposure Yes.  Started yesterday. Is it ok to call in tamiflu for this patient since she has cancer and seeing oncology. States she has been taking advil scheduled every 4 hours and temp is still up to 101 and she has been shaking with chills. Ok to take tylenol in addition to advil and call in tamiflu?  Recommendation:  Fluid, rest, Tylenol for control of fever  Expect 5 days before feeling better and not so contagious and generally better in 7-10 days.  Standard treatment Tama flu 75 mg 1 tablet twice daily times 5 days  Please call office if symptoms do not improve or worsen in 5 days after beginning treatment.

## 2015-11-06 ENCOUNTER — Other Ambulatory Visit: Payer: Self-pay | Admitting: Oncology

## 2015-11-06 ENCOUNTER — Other Ambulatory Visit: Payer: Self-pay | Admitting: Family Medicine

## 2015-11-09 ENCOUNTER — Encounter: Payer: Self-pay | Admitting: Family Medicine

## 2015-11-09 ENCOUNTER — Ambulatory Visit (INDEPENDENT_AMBULATORY_CARE_PROVIDER_SITE_OTHER): Payer: BLUE CROSS/BLUE SHIELD | Admitting: Family Medicine

## 2015-11-09 ENCOUNTER — Ambulatory Visit
Admission: RE | Admit: 2015-11-09 | Discharge: 2015-11-09 | Disposition: A | Discharge status: Home / Self Care | Payer: BLUE CROSS/BLUE SHIELD | Source: Ambulatory Visit | Attending: Oncology | Admitting: Oncology

## 2015-11-09 VITALS — BP 128/82 | HR 71 | Resp 16 | Ht 66.0 in | Wt 211.0 lb

## 2015-11-09 DIAGNOSIS — Z1159 Encounter for screening for other viral diseases: Secondary | ICD-10-CM

## 2015-11-09 DIAGNOSIS — C50412 Malignant neoplasm of upper-outer quadrant of left female breast: Secondary | ICD-10-CM

## 2015-11-09 DIAGNOSIS — E785 Hyperlipidemia, unspecified: Secondary | ICD-10-CM | POA: Diagnosis not present

## 2015-11-09 DIAGNOSIS — I10 Essential (primary) hypertension: Secondary | ICD-10-CM | POA: Diagnosis not present

## 2015-11-09 DIAGNOSIS — F418 Other specified anxiety disorders: Secondary | ICD-10-CM

## 2015-11-09 DIAGNOSIS — E1121 Type 2 diabetes mellitus with diabetic nephropathy: Secondary | ICD-10-CM | POA: Diagnosis not present

## 2015-11-09 DIAGNOSIS — G4700 Insomnia, unspecified: Secondary | ICD-10-CM

## 2015-11-09 NOTE — Patient Instructions (Addendum)
F/u in 4. month, call if you need me sooner  I am TRULY THANKFUL that you have had very successful surgery and that your surgical scars are healing very well  Please get the flu vaccine in the next 3 weeks  CONGRATS on weight loss and improved blood sugar and cholesterol, KEEP IT UP!!  Thanks for choosing [facility], we consider it a privelige to serve you.  All the best for 2017!  Thanks for choosing [facility], we consider it a privelige to serve you.

## 2015-11-09 NOTE — Progress Notes (Signed)
Subjective:    Patient ID: Jennifer [name], female    DOB: 1956/09/09, 59 y.o.   MRN: [id number]  HPI   Jennifer [name]     MRN: [id number]      DOB: 1956-01-29   HPI Jennifer [name] is here for follow up and re-evaluation of chronic medical conditions, medication management and review of any available recent lab and radiology data.  Preventive health is updated, specifically  Cancer screening and Immunization.   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 Has decided against breast re construction and is very comfortable with flat chest as is her spouse. I also made her aware of the option for prosthesis  Once fully healed The PT denies any adverse reactions to current medications since the last visit.  Has recovered very well from acute influenza which she called in about last week, tamiflu has worked for her! Significant weight loss and improved labs as a result Intends to see someone re her PTSD once completely healed. At this time has decided against bariatric surgery, sleep study was very traumatic, and she has successfully lost over 20 pounds since last seen, has stopped the ice cream ROS Denies recent fever or chills. Denies sinus pressure, nasal congestion, ear pain or sore throat. Denies chest congestion, productive cough or wheezing. Denies chest pains, palpitations and leg swelling Denies abdominal pain, nausea, vomiting,diarrhea or constipation.   Denies dysuria, frequency, hesitancy or incontinence. Denies joint pain, swelling and limitation in mobility. Denies headaches, seizures, numbness, or tingling. Denies uncontrolled  depression, anxiety or insomnia.   PE  BP 128/82 mmHg  Pulse 71  Resp 16  Ht 5\' 6"  (1.676 m)  Wt 211 lb (95.709 kg)  BMI 34.07 kg/m2  SpO2 98%  Patient alert and oriented and in no cardiopulmonary distress.  HEENT: No facial asymmetry, EOMI,   oropharynx pink and moist.  Neck supple no JVD, no  mass.  Chest: Clear to auscultation bilaterally.  CVS: S1, S2 no murmurs, no S3.Regular rate.  ABD: Soft non tender.   Ext: No edema  MS: Adequate ROM spine, shoulders, hips and knees.  Skin: Intact, no ulcerations or rash noted.  Psych: Good eye contact, normal affect. Memory intact not anxious or depressed appearing.  CNS: CN 2-12 intact, power,  normal throughout.no focal deficits noted.   Assessment & Plan   Essential hypertension Controlled, no change in medication DASH diet and commitment to daily physical activity for a minimum of 30 minutes discussed and encouraged, as a part of hypertension management. The importance of attaining a healthy weight is also discussed.  BP/Weight 11/09/2015 08/24/2015 07/05/2015 07/03/2015 06/26/2015 06/07/2015 [id number]  Systolic BP 0000000 0000000 [id number] - [id number]  Diastolic BP 82 95 63 - 66 62 74  Wt. (Lbs) 211 219.8 - 238.7 238.7 238.6 235  BMI 34.07 35.49 - 38.55 38.55 38.53 37.95        Morbid obesity Improved Patient re-educated about  the importance of commitment to a  minimum of 150 minutes of exercise per week.  The importance of healthy food choices with portion control discussed. Encouraged to start a food diary, count calories and to consider  joining a support group. Sample diet sheets offered. Goals set by the patient for the next several months.   Weight /BMI 11/09/2015 08/24/2015 07/03/2015  WEIGHT 211 lb 219 lb 12.8 oz 238 lb 11.2 oz  HEIGHT 5\' 6"  5\' 6"  5\' 6"   BMI [id number] kg/m2  35.49 kg/m2 38.55 kg/m2    Current exercise per week 40 minutes.   Hyperlipidemia LDL goal <100 Improved, continue zetia and low fat diet  Insomnia Improved, uses half tablet most nights reportedly   Depression with anxiety Improved and currently controlled. Plans to meet with therapist next year re her PTSD   Diabetes mellitus with nephropathy ([place]) Improved since weight loss and dietary change. Continue same Jennifer [name] is reminded  of the importance of commitment to daily physical activity for 30 minutes or more, as able and the need to limit carbohydrate intake to 30 to 60 grams per meal to help with blood sugar control.   The need to take medication as prescribed, test blood sugar as directed, and to call between visits if there is a concern that blood sugar is uncontrolled is also discussed.   Jennifer [name] is reminded of the importance of daily foot exam, annual eye examination, and good blood sugar, blood pressure and cholesterol control.  Diabetic Labs Latest Ref Rng 08/24/2015 07/05/2015 07/04/2015 06/26/2015 06/07/2015  HbA1c 4.8 - 5.6 % - - - 7.4(H) -  Microalbumin 0.00 - 1.89 mg/dL - - - - -  Micro/Creat Ratio 0.0 - 30.0 mg/g - - - - -  Chol 0 - 200 mg/dL - - - - -  HDL >39 mg/dL - - - - -  Calc LDL - - - - - -  Triglycerides <150 mg/dL - - - - -  Creatinine 0.6 - 1.1 mg/dL 0.9 1.28(H) 1.06(H) 1.19(H) 1.2(H)   BP/Weight 11/09/2015 08/24/2015 07/05/2015 07/03/2015 06/26/2015 06/07/2015 [id number]  Systolic BP 0000000 0000000 Q000111Q - [id number]  Diastolic BP 82 95 63 - 66 62 74  Wt. (Lbs) 211 219.8 - 238.7 238.7 238.6 235  BMI 34.07 35.49 - 38.55 38.55 38.53 37.95   Foot/eye exam completion dates 06/20/2014  Foot Form Completion Done              Review of Systems     Objective:   Physical Exam        Assessment & Plan:

## 2015-11-10 NOTE — Assessment & Plan Note (Signed)
Improved, continue zetia and low fat diet

## 2015-11-10 NOTE — Assessment & Plan Note (Signed)
Controlled, no change in medication DASH diet and commitment to daily physical activity for a minimum of 30 minutes discussed and encouraged, as a part of hypertension management. The importance of attaining a healthy weight is also discussed.  BP/Weight 11/09/2015 08/24/2015 07/05/2015 07/03/2015 06/26/2015 06/07/2015 [id number]  Systolic BP 0000000 0000000 [id number] - [id number]  Diastolic BP 82 95 63 - 66 62 74  Wt. (Lbs) 211 219.8 - 238.7 238.7 238.6 235  BMI 34.07 35.49 - 38.55 38.55 38.53 37.95

## 2015-11-10 NOTE — Assessment & Plan Note (Signed)
Improved since weight loss and dietary change. Continue same Jennifer [name] is reminded of the importance of commitment to daily physical activity for 30 minutes or more, as able and the need to limit carbohydrate intake to 30 to 60 grams per meal to help with blood sugar control.   The need to take medication as prescribed, test blood sugar as directed, and to call between visits if there is a concern that blood sugar is uncontrolled is also discussed.   Jennifer [name] is reminded of the importance of daily foot exam, annual eye examination, and good blood sugar, blood pressure and cholesterol control.  Diabetic Labs Latest Ref Rng 08/24/2015 07/05/2015 07/04/2015 06/26/2015 06/07/2015  HbA1c 4.8 - 5.6 % - - - 7.4(H) -  Microalbumin 0.00 - 1.89 mg/dL - - - - -  Micro/Creat Ratio 0.0 - 30.0 mg/g - - - - -  Chol 0 - 200 mg/dL - - - - -  HDL >39 mg/dL - - - - -  Calc LDL - - - - - -  Triglycerides <150 mg/dL - - - - -  Creatinine 0.6 - 1.1 mg/dL 0.9 1.28(H) 1.06(H) 1.19(H) 1.2(H)   BP/Weight 11/09/2015 08/24/2015 07/05/2015 07/03/2015 06/26/2015 06/07/2015 [id number]  Systolic BP 0000000 0000000 [id number] - 123456 [id number]  Diastolic BP 82 95 63 - 66 62 74  Wt. (Lbs) 211 219.8 - 238.7 238.7 238.6 235  BMI 34.07 35.49 - 38.55 38.55 38.53 37.95   Foot/eye exam completion dates 06/20/2014  Foot Form Completion Done

## 2015-11-10 NOTE — Assessment & Plan Note (Signed)
Improved, uses half tablet most nights reportedly

## 2015-11-10 NOTE — Assessment & Plan Note (Signed)
Improved and currently controlled. Plans to meet with therapist next year re her PTSD

## 2015-11-10 NOTE — Assessment & Plan Note (Signed)
Improved Patient re-educated about  the importance of commitment to a  minimum of 150 minutes of exercise per week.  The importance of healthy food choices with portion control discussed. Encouraged to start a food diary, count calories and to consider  joining a support group. Sample diet sheets offered. Goals set by the patient for the next several months.   Weight /BMI 11/09/2015 08/24/2015 07/03/2015  WEIGHT 211 lb 219 lb 12.8 oz 238 lb 11.2 oz  HEIGHT 5\' 6"  5\' 6"  5\' 6"   BMI 34.07 kg/m2 35.49 kg/m2 38.55 kg/m2    Current exercise per week 40 minutes.

## 2015-11-11 LAB — MICROALBUMIN / CREATININE URINE RATIO
Creatinine, Urine: 157 mg/dL (ref 20–320)
MICROALB UR: 1 mg/dL
Microalb Creat Ratio: 6 mcg/mg creat (ref <30)

## 2015-11-12 HISTORY — PX: BARIATRIC SURGERY: SHX1103

## 2015-11-20 ENCOUNTER — Other Ambulatory Visit: Payer: Self-pay | Admitting: *Deleted

## 2015-11-20 ENCOUNTER — Ambulatory Visit: Payer: BLUE CROSS/BLUE SHIELD | Admitting: Oncology

## 2015-11-20 ENCOUNTER — Telehealth: Payer: Self-pay | Admitting: Oncology

## 2015-11-20 NOTE — Telephone Encounter (Signed)
S/w patient as she request to reschedule her appointment

## 2015-11-21 ENCOUNTER — Encounter: Payer: Self-pay | Admitting: Family Medicine

## 2015-11-30 ENCOUNTER — Other Ambulatory Visit: Payer: Self-pay | Admitting: Family Medicine

## 2015-12-19 ENCOUNTER — Encounter: Payer: Self-pay | Admitting: *Deleted

## 2015-12-19 ENCOUNTER — Ambulatory Visit (HOSPITAL_BASED_OUTPATIENT_CLINIC_OR_DEPARTMENT_OTHER): Payer: BLUE CROSS/BLUE SHIELD | Admitting: Oncology

## 2015-12-19 VITALS — BP 148/73 | HR 72 | Temp 97.8°F | Resp 18 | Ht 66.0 in | Wt 215.6 lb

## 2015-12-19 DIAGNOSIS — N951 Menopausal and female climacteric states: Secondary | ICD-10-CM

## 2015-12-19 DIAGNOSIS — E1121 Type 2 diabetes mellitus with diabetic nephropathy: Secondary | ICD-10-CM

## 2015-12-19 DIAGNOSIS — C50412 Malignant neoplasm of upper-outer quadrant of left female breast: Secondary | ICD-10-CM

## 2015-12-19 DIAGNOSIS — Z170 Estrogen receptor positive status [ER+]: Secondary | ICD-10-CM | POA: Diagnosis not present

## 2015-12-19 DIAGNOSIS — C50912 Malignant neoplasm of unspecified site of left female breast: Secondary | ICD-10-CM | POA: Diagnosis not present

## 2015-12-19 NOTE — Progress Notes (Signed)
[facility]  [phone] [phone]     ID: Jennifer [name] DOB: 04-Jan-1956  MR#: [id number]  HEN#:[id number]  Patient Care Team: Jennifer Helper, [name] as PCP - General Jennifer Seltzer, [name] as Consulting Physician (General Surgery) Jennifer Cruel, [name] as Consulting Physician (Oncology) Jennifer Rudd, [name] as Consulting Physician (Radiation Oncology) Jennifer Kaufmann, RN as Registered Nurse Jennifer Germany, RN as Registered Nurse Jennifer Cheese, NP as Nurse Practitioner (Nurse Practitioner) PCP: Jennifer [name] GYN: Jennifer [name] Jennifer [name]:  OTHER [name]:  CHIEF COMPLAINT: Estrogen receptor positive breast cancer  CURRENT TREATMENT: anastrozole   BREAST CANCER HISTORY: From the original intake note:  [name] had screening mammography at [facility] 05/25/2015. This showed a spiculated mass in the left breast and the patient was referred to [facility] where on 05/31/2015 she underwent ultrasonography of the left breast. This showed a 1 cm lobulated mass in the left breast superiorly, measuring 1.0 cm. This was felt to be highly suspicious. Accordingly on 05/31/2015 the patient underwent left breast biopsy showing ([id number]) an invasive ductal carcinoma, grade 1 or 2 , estrogen and progesterone receptor positive, with an MIB-1 of 8, and HER-2 not amplified, the signals ratio being 1.33 and the number per cell 2.85.  Her subsequent history is as detailed below  INTERVAL HISTORY: Jennifer [name] returns today for follow-up of her early stage breast cancer accompanied by her spouse Jennifer [name]. Soma started anastrozole in November. She has had worsening problems with hot flashes and night sweats, interrupted by chills. She has not had a significant problem with worsening vaginal dryness although vaginal dryness is an issue. She obtains a drug in approximately $20 a month.   Psychologically she has been doing worse: Crying a lot, refusing to get out of bed, sleeping all  day sometimes. She is more anxious than she used to be. She is jumpy although not irritable. The depression aspect of course is not a new problem, and has a long and very unfortunate history. It may be slightly worse than before.  REVIEW OF SYSTEMS: She sleeps poorly but then of course she can sleep all day as just noted. She is very fatigued. She has pain in the surgical breast and a little bit in the axilla as well. She describes the pain as stabbing throbbing achy and cramping. She feels horse. She has sinus symptoms. She has chest wall discomfort. Occasionally she has palpitations. Again these are not new symptoms. She has a dry cough. She has very minimal problems with heartburn. For the past few months she has had some stress urinary incontinence symptoms which are relatively new. She also has pain in her back and joints. She has occasional headaches. She feels forgetful and anxious and depressed. She has phobias at times. A detailed review of systems was otherwise stable.  PAST MEDICAL HISTORY: Past Medical History  Diagnosis Date  . ALLERGIC RHINITIS   . Hypertension   . Herpes zoster   . Hyperlipidemia   . Depression   . Cataract     beginning to form  . Anxiety   . GERD (gastroesophageal reflux disease)   . Bell's palsy June 2016    [facility]  . PTSD (post-traumatic stress disorder)     Traumatic insomnia  . Frequency of urination   . Edema of both legs     2+ pitting edema  . Breast cancer of upper-outer quadrant of left female breast ([place]) 06/02/2015  . Heart murmur   .  Myocardial infarction [facility])     "some indication that I've had a mild MI in my past on my workup for geriatric bypass recently" (07/04/2015)  . Pneumonia     "several times"  . Sleep apnea     "mask not registering time I was using it; so insurance company took it back; will start wearing it again" (07/04/2015)  . Type II diabetes mellitus ([place])   . Arthritis     "joints; mostly hands,  wrists, elbows, knees" (07/04/2015)    PAST SURGICAL HISTORY: Past Surgical History  Procedure Laterality Date  . Orif tibia & fibula fractures Left 12/23/2008    "pins"  . Bone exostosis excision  09/12/2011    Procedure: EXOSTOSIS EXCISION;  Surgeon: Jennifer [name];  Location: [facility];  Service: Orthopedics;  Laterality: Right;  Retrocalcaneal Exostectomy Right Foot  . Colonoscopy    . Fracture surgery    . Laparoscopic cholecystectomy  04/2010  . Breast biopsy Left 05/2015  . Mastectomy complete / simple w/ sentinel node biopsy Left 07/03/2015  . Mastectomy complete / simple Right 07/03/2015    PROPHYLACTIC   . Ankle fracture surgery Left 12/23/2008    "pins"  . Simple mastectomy with axillary sentinel node biopsy Left 07/03/2015    Procedure: LEFT TOTAL  MASTECTOMY WITH LEFT  AXILLARY SENTINEL NODE BIOPSY;  Surgeon: Jennifer Seltzer, [name];  Location: [place];  Service: General;  Laterality: Left;  . Total mastectomy Right 07/03/2015    Procedure: RIGHT TOTAL PROPHYLACTIC MASTECTOMY;  Surgeon: Jennifer Seltzer, [name];  Location: [place];  Service: General;  Laterality: Right;    FAMILY HISTORY Family History  Problem Relation Age of Onset  . Cancer Mother     lung  . Hypertension Sister     x2  . Colon polyps Sister 57  . Heart disease Brother   . Esophageal cancer Brother   . Hyperlipidemia Brother   . Cancer Brother   . Depression Sister   . Anesthesia problems Neg Hx   . Colon cancer Neg Hx   . Rectal cancer Neg Hx   . Stomach cancer Neg Hx    the patient's father died in an automobile accident the edge of 57. The patient's mother was diagnosed with lung cancer at the age of 37, shortly before her death from that call us. She was a smoker. The patient had 4 brothers, 2 sisters. One brother was diagnosed with esophageal cancer at age 84. There is no history of breast or ovarian cancer in the family.  GYNECOLOGIC HISTORY:  No LMP recorded. Patient is postmenopausal. Menarche  age 50, the patient is GX P0. She stopped having periods approximately age 50. She took hormone replacement approximately one year. 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 She married Jennifer [name] 08/15/2009 in [place]. It is just the 2 of them at home, with 2 dogs. They also foster children at times. They used to live in [place], so they have their medical care in this area. 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 The patient is not a church attender    ADVANCED DIRECTIVES: In place   HEALTH MAINTENANCE: Social History  Substance Use Topics  . Smoking status: Never Smoker   . Smokeless tobacco: Never Used  . Alcohol Use: No     Colonoscopy: SEPT 2013  PAP:  Bone density: never Lipid panel:  Allergies  Allergen  Reactions  . Buspar [Buspirone] Other (See Comments)    Reports unsteady gait and nervousness  . Trazodone And Nefazodone Other (See Comments)    Reports unsteady gait, nervousness   . Duloxetine Other (See Comments)    REACTION: sxual side effects  . Mushroom Extract Complex Nausea And Vomiting  . Pravastatin Other (See Comments)    Muscle aches    Current Outpatient Prescriptions  Medication Sig Dispense Refill  . alprazolam (XANAX) 2 MG tablet TAKE 1 TABLET BY MOUTH AT BEDTIME IF NEEDED FOR SLEEP 30 tablet 1  . anastrozole (ARIMIDEX) 1 MG tablet Take 1 tablet (1 mg total) by mouth daily. 90 tablet 4  . aspirin EC 325 MG tablet Take 325 mg by mouth daily.    Marland Kitchen atenolol-chlorthalidone (TENORETIC) 50-25 MG per tablet TAKE 1 TABLET BY MOUTH EVERY DAY (Patient taking differently: TAKE 0.5 TABLET BY MOUTH EVERY DAY) 30 tablet 3  . BENICAR 40 MG tablet TAKE 1 TABLET BY MOUTH EVERY DAY 30 tablet 3  . buPROPion (WELLBUTRIN XL) 300 MG 24 hr tablet TAKE 1 TABLET BY MOUTH EVERY MORNING 30 tablet 2  . fluticasone (FLONASE) 50 MCG/ACT nasal spray Place 1 spray into both nostrils  daily.    Marland Kitchen gabapentin (NEURONTIN) 300 MG capsule TAKE ONE CAPSULE BY MOUTH 4 TIMES A DAY AS NEEDED 60 capsule 2  . potassium chloride (K-DUR) 10 MEQ tablet Take 1 tablet (10 mEq total) by mouth daily. 14 tablet 0  . traZODone (DESYREL) 100 MG tablet TAKE 1 TABLET BY MOUTH AT BEDTIME 30 tablet 2  . ZETIA 10 MG tablet TAKE 1 TABLET BY MOUTH EVERY DAY 90 tablet 0   No current facility-administered medications for this visit.    OBJECTIVE: Middle-aged white woman in no acute distress Filed Vitals:   12/19/15 1353  BP: 148/73  Pulse: 72  Temp: 97.8 F (36.6 C)  Resp: 18     Body mass index is 34.82 kg/(m^2).    ECOG FS:2 - Symptomatic, <50% confined to bed  Sclerae unicteric, EOMs intact Oropharynx clear, no thrush or other lesions No cervical or supraclavicular adenopathy Lungs no rales or rhonchi Heart regular rate and rhythm Abd soft, obese, nontender, positive bowel sounds MSK no focal spinal tenderness, no upper extremity lymphedema Neuro: nonfocal, well oriented, appropriate affect Breasts: Status post bilateral mastectomies. There is some irregularity to the incisions, particularly with superfluous tissue laterally on both sides. Also in the medial aspect of the left incision there is some fatty tissue which distorts the chest plane. There is no evidence of local recurrence. Both axillae are benign.   LAB RESULTS:  CMP     Component Value Date/Time   NA 140 08/24/2015 1444   NA 135 07/05/2015 0348   K 3.5 08/24/2015 1444   K 3.2* 07/05/2015 0348   CL 99* 07/05/2015 0348   CO2 24 08/24/2015 1444   CO2 28 07/05/2015 0348   GLUCOSE 147* 08/24/2015 1444   GLUCOSE 182* 07/05/2015 0348   BUN 13.9 08/24/2015 1444   BUN 19 07/05/2015 0348   CREATININE 0.9 08/24/2015 1444   CREATININE 1.28* 07/05/2015 0348   CREATININE 1.01 12/13/2013 1230   CREATININE 0.98 12/26/2008 1626   CALCIUM 9.3 08/24/2015 1444   CALCIUM 8.1* 07/05/2015 0348   PROT 7.6 08/24/2015 1444   PROT 7.1  12/13/2013 1230   ALBUMIN 3.8 08/24/2015 1444   ALBUMIN 4.1 12/13/2013 1230   AST 36* 08/24/2015 1444   AST 22 12/13/2013 1230  ALT 64* 08/24/2015 1444   ALT 23 12/13/2013 1230   ALKPHOS 110 08/24/2015 1444   ALKPHOS 74 12/13/2013 1230   BILITOT 0.43 08/24/2015 1444   BILITOT 0.4 12/13/2013 1230   GFRNONAA 45* 07/05/2015 0348   GFRNONAA 62 12/13/2013 1230   GFRAA 52* 07/05/2015 0348   GFRAA 71 12/13/2013 1230    INo results found for: SPEP, UPEP  Lab Results  Component Value Date   WBC 7.7 08/24/2015   NEUTROABS 4.6 08/24/2015   HGB 13.7 08/24/2015   HCT 42.3 08/24/2015   MCV 94.6 08/24/2015   PLT 311 08/24/2015      Chemistry      Component Value Date/Time   NA 140 08/24/2015 1444   NA 135 07/05/2015 0348   K 3.5 08/24/2015 1444   K 3.2* 07/05/2015 0348   CL 99* 07/05/2015 0348   CO2 24 08/24/2015 1444   CO2 28 07/05/2015 0348   BUN 13.9 08/24/2015 1444   BUN 19 07/05/2015 0348   CREATININE 0.9 08/24/2015 1444   CREATININE 1.28* 07/05/2015 0348   CREATININE 1.01 12/13/2013 1230   CREATININE 0.98 12/26/2008 1626      Component Value Date/Time   CALCIUM 9.3 08/24/2015 1444   CALCIUM 8.1* 07/05/2015 0348   ALKPHOS 110 08/24/2015 1444   ALKPHOS 74 12/13/2013 1230   AST 36* 08/24/2015 1444   AST 22 12/13/2013 1230   ALT 64* 08/24/2015 1444   ALT 23 12/13/2013 1230   BILITOT 0.43 08/24/2015 1444   BILITOT 0.4 12/13/2013 1230       No results found for: LABCA2  No components found for: LABCA125  No results for input(s): INR in the last 168 hours.  Urinalysis    Component Value Date/Time   COLORURINE YELLOW 04/25/2010 2211   APPEARANCEUR CLEAR 04/25/2010 2211   LABSPEC >1.030* 04/25/2010 2211   PHURINE 6.0 04/25/2010 2211   GLUCOSEU NEGATIVE 04/25/2010 2211   GLUCOSEU NEGATIVE 12/30/2007 1520   HGBUR TRACE* 04/25/2010 2211   BILIRUBINUR NEGATIVE 04/25/2010 2211   KETONESUR TRACE* 04/25/2010 2211   PROTEINUR TRACE* 04/25/2010 2211    UROBILINOGEN 0.2 04/25/2010 2211   NITRITE NEGATIVE 04/25/2010 2211   LEUKOCYTESUR NEGATIVE 04/25/2010 2211    STUDIES:  EXAM: DUAL X-RAY ABSORPTIOMETRY (DXA) FOR BONE MINERAL DENSITY  IMPRESSION: Referring Physician: Chauncey [name]  PATIENT: Name: Jennifer [name], Jennifer [name] Patient ID: [id number] Birth Date: 29-May-1956 Height: 64.0 in. Sex: Female Measured: 11/09/2015 Weight: 210.0 lbs.  Indications: Anastrazole, Breast Cancer History, Caucasian, Depression, Family History of Osteoporosis, Gabapentin, Height Loss (781.91), History of Fracture (Adult) (V15.51), Wellbutrin Fractures: Ankle, Tib/Fib Treatments: Calcium (E943.0), Vitamin D (E933.5)  ASSESSMENT: The BMD measured at Femur Neck Left is 1.035 g/cm2 with a T-score of 0.0. This patient is considered normal according to [facility]) criteria.L-3, L-4 were excluded due to degenerative changes.  Site Region Measured Date Measured Age [age]A BMD Significant CHANGE T-score  DualFemur Neck Left 11/09/2015 58.[age] 1.035 g/cm2 [facility] Spine L1-L2 11/09/2015 58.9 [age] 1.233 g/cm2     ASSESSMENT: 60 y.o. [place], [place] woman status post left breast biopsy 05/31/2015 for a clinical T1b N0, stage IA  Invasive ductal carcinoma, low to intermediate grade, estrogen and progesterone receptor positive, with HER-2 not amplified, and an MIB-1 of 8%  (1) status post bilateral mastectomies and left sided sentinel lymph node sampling 07/03/2015, showing  (a) on the right, no atypia or malignancy  (b) on the left, a pT2 pN0, stage IIA Invasive ductal carcinoma,  grade 2 repeat HER-2 again negative   (2) Oncotype score of 21 predicts a 10 year risk of recurrence outside the breast of 14% if the patient's only systemic therapy is tamoxifen for 5 years.  (a) after appropriate discussion the patient opted against chemotherapy given the very marginal benefit predicted  (3) anastrozole started  October 2016  (a) bone density 11/09/2015 was normal  PLAN: It is difficult to know how well Zyona is tolerating the anastrozole because of the accompanying severe depressive symptoms. These are not going to be due to the anastrozole. She is already on medication for this and she does have follow-up for that problem. In general, tamoxifen affects hot flashes and vaginal dryness and bone density but not mood as compared to placebo.  The hot flashes are definitely worse. She is taking gabapentin very irregularly for her chest wall discomfort. She does have occasional "shooting pains" which are very difficult to treat since they have resolved by the time 1 reaches for analgesics. However gabapentin can be helpful in that regard as well. It is a type of pain medication that works chiefly by "quieting nerves". It also is very effective for hot flashes control. The main side effect is sleepiness, but she has not been having that problem.  I urged her to take the gabapentin 4 times a day as prescribed. After she has done that for perhaps a week if she has not had sufficient benefit she will let me know and I will added TTS 1 patch. Her blood pressure certainly can barely that. If that does not work we can consider switching to tamoxifen which would allow [place] to use may gastro-all. Given her concerns regarding weight however that might not be an acceptable option for her.  She would like to have a "smooth chest" and she will discuss with Dr. Excell [name] the possibility of having some of the more irregular "bumps" along her scars removed either by Dr. Excell [name] himself for by a plastic surgeon. She is not interested in reconstruction.  I urged Nikiya to start a regular exercise program, 45 minutes a day, even in days when she feels she can't get out of bed. [place] apparently a [place] too [place] and she could consider water aerobics there. They could also try to get a [employer] program started.  Otherwise she  will return to see me in 3 months, preferably the same day she will be seeing Dr. Excell [name] so she does not have to make an extra trip here. She knows to call for any problems that may develop before her next visit here.   Jennifer Cruel, [name]   12/19/2015 2:10 PM Medical Oncology and Hematology [facility] [address] [place], [place] [address] Tel. [phone]    Fax. [phone]

## 2016-01-31 ENCOUNTER — Other Ambulatory Visit: Payer: Self-pay | Admitting: Family Medicine

## 2016-01-31 ENCOUNTER — Other Ambulatory Visit: Payer: Self-pay

## 2016-02-02 ENCOUNTER — Other Ambulatory Visit: Payer: Self-pay | Admitting: Family Medicine

## 2016-02-14 ENCOUNTER — Other Ambulatory Visit: Payer: Self-pay

## 2016-02-14 ENCOUNTER — Telehealth: Payer: Self-pay | Admitting: *Deleted

## 2016-02-14 MED ORDER — TRAZODONE HCL 100 MG PO TABS: 100.0000 mg | ORAL_TABLET | Freq: Every day | ORAL | Status: DC

## 2016-02-14 NOTE — Telephone Encounter (Signed)
  Oncology Nurse Navigator Documentation    Navigator Encounter Type: Telephone (02/14/16 1000)                   Interventions: Coordination of Care (02/14/16 1000)   Coordination of Care: Appts (Coordination of Drs. Hoxworth and Magrinat's appts- 6/5) (02/14/16 1000)        Acuity: Level 2 (02/14/16 1000)         Time Spent with Patient: 30 (02/14/16 1000)

## 2016-02-16 ENCOUNTER — Other Ambulatory Visit: Payer: Self-pay | Admitting: *Deleted

## 2016-02-16 MED ORDER — GABAPENTIN 300 MG PO CAPS: 300.0000 mg | ORAL_CAPSULE | Freq: Four times a day (QID) | ORAL | Status: DC

## 2016-02-21 ENCOUNTER — Other Ambulatory Visit: Payer: Self-pay | Admitting: Family Medicine

## 2016-02-22 ENCOUNTER — Encounter: Payer: Self-pay | Admitting: *Deleted

## 2016-02-27 ENCOUNTER — Telehealth: Payer: Self-pay | Admitting: Family Medicine

## 2016-02-27 NOTE — Telephone Encounter (Signed)
Jennifer [name] is calling stating that she is having he Bariatric Surgery on April 20th an she has questions regarding her medications

## 2016-02-27 NOTE — Telephone Encounter (Signed)
Just wanted Jennifer [name] to know she was getting ready to have her surgery

## 2016-04-14 NOTE — Progress Notes (Signed)
No show

## 2016-04-15 ENCOUNTER — Encounter: Payer: BLUE CROSS/BLUE SHIELD | Admitting: Oncology

## 2016-04-15 ENCOUNTER — Telehealth: Payer: Self-pay | Admitting: Oncology

## 2016-04-15 ENCOUNTER — Telehealth: Payer: Self-pay | Admitting: *Deleted

## 2016-04-15 ENCOUNTER — Other Ambulatory Visit: Payer: BLUE CROSS/BLUE SHIELD

## 2016-04-15 NOTE — Telephone Encounter (Signed)
Communication received from [facility] stating pt called on 6/5/2017at 12:55 am to cancel scheduled lab and MD follow up for today.  POF sent to scheduling to reschedule to next available.

## 2016-04-15 NOTE — Telephone Encounter (Signed)
left msg confirmed 6/6 apt - which was next available until 6/21

## 2016-04-16 ENCOUNTER — Ambulatory Visit: Payer: BLUE CROSS/BLUE SHIELD | Admitting: Oncology

## 2016-04-16 ENCOUNTER — Telehealth: Payer: Self-pay | Admitting: *Deleted

## 2016-04-16 ENCOUNTER — Other Ambulatory Visit: Payer: BLUE CROSS/BLUE SHIELD

## 2016-04-16 NOTE — Telephone Encounter (Signed)
Pt called to relay she will not be able to make today's appt with Dr. Jana H[place] appts with Dr. Excell Seltzer and Magrinat. Pt will call back to r/s appt. Denies further needs at this time.

## 2016-05-27 ENCOUNTER — Other Ambulatory Visit: Payer: Self-pay | Admitting: Family Medicine

## 2016-06-03 ENCOUNTER — Encounter: Payer: Self-pay | Admitting: Family Medicine

## 2016-06-03 LAB — HEMOGLOBIN A1C: Hemoglobin A1C: 5.7

## 2016-06-03 LAB — LIPID PANEL: LDL CALC: 127 mg/dL

## 2016-06-07 ENCOUNTER — Other Ambulatory Visit: Payer: Self-pay

## 2016-06-07 ENCOUNTER — Ambulatory Visit (INDEPENDENT_AMBULATORY_CARE_PROVIDER_SITE_OTHER): Payer: BLUE CROSS/BLUE SHIELD | Admitting: Family Medicine

## 2016-06-07 ENCOUNTER — Encounter: Payer: Self-pay | Admitting: Family Medicine

## 2016-06-07 VITALS — BP 120/82 | HR 74 | Resp 16 | Ht 64.5 in | Wt 170.0 lb

## 2016-06-07 DIAGNOSIS — I10 Essential (primary) hypertension: Secondary | ICD-10-CM

## 2016-06-07 DIAGNOSIS — F418 Other specified anxiety disorders: Secondary | ICD-10-CM

## 2016-06-07 DIAGNOSIS — G4700 Insomnia, unspecified: Secondary | ICD-10-CM

## 2016-06-07 DIAGNOSIS — E1121 Type 2 diabetes mellitus with diabetic nephropathy: Secondary | ICD-10-CM | POA: Diagnosis not present

## 2016-06-07 DIAGNOSIS — Z23 Encounter for immunization: Secondary | ICD-10-CM | POA: Diagnosis not present

## 2016-06-07 DIAGNOSIS — E8881 Metabolic syndrome: Secondary | ICD-10-CM

## 2016-06-07 DIAGNOSIS — E559 Vitamin D deficiency, unspecified: Secondary | ICD-10-CM

## 2016-06-07 DIAGNOSIS — E785 Hyperlipidemia, unspecified: Secondary | ICD-10-CM

## 2016-06-07 NOTE — Patient Instructions (Signed)
F./u in January, call if you need me before  CONGRATS , I am happy with your improved health  Pls substitiute  beans  For some animal protein, and concentrate on white  Meat baked or grilled  I will send a note requesting your psych to be responsible with meds  You will get notice re thyroid result and vit D  Fasting lipid, cmp and EGFR and hBa1C in January  It is important that you exercise regularly at least 30 minutes75 times a week. If you develop chest pain, have severe difficulty breathing, or feel very tired, stop exercising immediately and seek medical attention   Thank you  for choosing [facility]. We consider it a privelige to serve you.  Delivering excellent health care in a caring and  compassionate way is our goal.  Partnering with you,  so that together we can achieve this goal is our strategy.  Pls  Get flu vaccine and send record   Pneumonia vaccine today

## 2016-06-09 ENCOUNTER — Encounter: Payer: Self-pay | Admitting: Family Medicine

## 2016-06-09 NOTE — Assessment & Plan Note (Signed)
The increased risk of cardiovascular disease associated with this diagnosis, and the need to consistently work on lifestyle to change this is discussed. Following  a  heart healthy diet ,commitment to 30 minutes of exercise at least 5 days per week, as well as control of blood sugar and cholesterol , and achieving a healthy weight are all the areas to be addressed .  

## 2016-06-09 NOTE — Progress Notes (Signed)
Jennifer [name]     MRN: [id number]      DOB: 30-Dec-1955   HPI Ms. Jennifer [name] is here for follow up and re-evaluation of chronic medical conditions, medication management and review of any available recent lab and radiology data.  Preventive health is updated, specifically  Cancer screening and Immunization.   Questions or concerns regarding consultations or procedures which the PT has had in the interim are  Addressed.Had bariatric surgery approx 4 months ago with excellent results to date, feeling better, still needs to improve exercise commitment. Intends to get reconstruction of chest/ breast due to excess skin, and is actually considering removal of excess skin where she has lost a lot of weight The PT denies any adverse reactions to current medications since the last visit.  Has found a psychiatrist in her area who she is working wit for her PTSD and is very satisfied, currently her scripts for mental health are still from this office butt reasonable she will be better served getting them from her psych, she agrees , and actually has an appt with her next week, I will send a note  ROS Denies recent fever or chills. Denies sinus pressure, nasal congestion, ear pain or sore throat. Denies chest congestion, productive cough or wheezing. Denies chest pains, palpitations and leg swelling Denies abdominal pain, nausea, vomiting,diarrhea or constipation.   Denies dysuria, frequency, hesitancy or incontinence. Denies joint pain, swelling and limitation in mobility. Denies headaches, seizures, numbness, or tingling. Denies uncontrolled  depression, anxiety or insomnia. Denies skin break down or rash.   PE  BP 120/82 (BP Location: Left Arm, Patient Position: Sitting, Cuff Size: Large)   Pulse 74   Resp 16   Ht 5\' 6"  (1.676 m)   Wt 170 lb (77.1 kg)   SpO2 96%   BMI 27.44 kg/m   Patient alert and oriented and in no cardiopulmonary distress.  HEENT: No facial asymmetry, EOMI,   oropharynx pink  and moist.  Neck supple no JVD, no mass.  Chest: Clear to auscultation bilaterally.  CVS: S1, S2 no murmurs, no S3.Regular rate.  ABD: Soft non tender.   Ext: No edema  MS: Adequate ROM spine, shoulders, hips and knees.  Skin: Intact, no ulcerations or rash noted.  Psych: Good eye contact, normal affect. Memory intact not anxious or depressed appearing.  CNS: CN 2-12 intact, power,  normal throughout.no focal deficits noted.   Assessment & Plan  Essential hypertension Controlled, no change in medication DASH diet and commitment to daily physical activity for a minimum of 30 minutes discussed and encouraged, as a part of hypertension management. The importance of attaining a healthy weight is also discussed.  BP/Weight 06/07/2016 12/19/2015 11/09/2015 08/24/2015 07/05/2015 07/03/2015 [id number]  Systolic BP 123456 123456 0000000 0000000 [id number] - 123456  Diastolic BP 82 73 82 95 63 - 66  Wt. (Lbs) 170 215.6 211 219.8 - 238.7 238.7  BMI 27.44 34.82 34.07 35.49 - 38.55 38.55       Morbid obesity Improved. Pt applauded on succesful weight loss through lifestyle change, and encouraged to continue same. Weight loss goal set for the next several months.   Hyperlipidemia LDL goal <100 Hyperlipidemia:Low fat diet discussed and encouraged.   Marked improvement though lDL still above goal, dietary change only     Vitamin D deficiency Updated lab needed will add on, currently taking 2 flinstones as supplement .   Diabetes mellitus with nephropathy ([place]) Almost totally corrected with weight loss assisted by bariatric  surgery. Pneumonia 23 due and administered  Insomnia Sleep hygiene reviewed and written information offered also. Prescription sent for  medication needed.   Metabolic syndrome X The increased risk of cardiovascular disease associated with this diagnosis, and the need to consistently work on lifestyle to change this is discussed. Following  a  heart healthy diet ,commitment to  30 minutes of exercise at least 5 days per week, as well as control of blood sugar and cholesterol , and achieving a healthy weight are all the areas to be addressed .   Depression with anxiety Improved, now established with local psych , will reach out to Doc to have her meds prescribed by psych only

## 2016-06-09 NOTE — Assessment & Plan Note (Signed)
Sleep hygiene reviewed and written information offered also. Prescription sent for  medication needed.  

## 2016-06-09 NOTE — Assessment & Plan Note (Signed)
Controlled, no change in medication DASH diet and commitment to daily physical activity for a minimum of 30 minutes discussed and encouraged, as a part of hypertension management. The importance of attaining a healthy weight is also discussed.  BP/Weight 06/07/2016 12/19/2015 11/09/2015 08/24/2015 07/05/2015 07/03/2015 [id number]  Systolic BP 123456 123456 0000000 0000000 Q000111Q - 123456  Diastolic BP 82 73 82 95 63 - 66  Wt. (Lbs) 170 215.6 211 219.8 - 238.7 238.7  BMI 27.44 34.82 34.07 35.49 - 38.55 38.55

## 2016-06-09 NOTE — Assessment & Plan Note (Signed)
Improved. Pt applauded on succesful weight loss through lifestyle change, and encouraged to continue same. Weight loss goal set for the next several months.  

## 2016-06-09 NOTE — Assessment & Plan Note (Signed)
Hyperlipidemia:Low fat diet discussed and encouraged.   Marked improvement though lDL still above goal, dietary change only

## 2016-06-09 NOTE — Assessment & Plan Note (Signed)
Updated lab needed will add on, currently taking 2 flinstones as supplement .

## 2016-06-09 NOTE — Assessment & Plan Note (Signed)
Improved, now established with local psych , will reach out to Doc to have her meds prescribed by psych only

## 2016-06-09 NOTE — Assessment & Plan Note (Signed)
Almost totally corrected with weight loss assisted by bariatric surgery. Pneumonia 23 due and administered

## 2016-06-10 ENCOUNTER — Encounter: Payer: Self-pay | Admitting: Family Medicine

## 2016-06-11 ENCOUNTER — Other Ambulatory Visit: Payer: Self-pay | Admitting: Family Medicine

## 2016-06-12 ENCOUNTER — Encounter: Payer: Self-pay | Admitting: Family Medicine

## 2016-06-17 ENCOUNTER — Encounter: Payer: Self-pay | Admitting: Family Medicine

## 2016-06-21 ENCOUNTER — Other Ambulatory Visit: Payer: Self-pay | Admitting: Family Medicine

## 2016-07-04 ENCOUNTER — Ambulatory Visit (HOSPITAL_BASED_OUTPATIENT_CLINIC_OR_DEPARTMENT_OTHER): Payer: BLUE CROSS/BLUE SHIELD | Admitting: Oncology

## 2016-07-04 VITALS — BP 123/84 | HR 66 | Temp 98.0°F | Resp 17 | Ht 64.5 in | Wt 162.3 lb

## 2016-07-04 DIAGNOSIS — N898 Other specified noninflammatory disorders of vagina: Secondary | ICD-10-CM

## 2016-07-04 DIAGNOSIS — N951 Menopausal and female climacteric states: Secondary | ICD-10-CM

## 2016-07-04 DIAGNOSIS — C50412 Malignant neoplasm of upper-outer quadrant of left female breast: Secondary | ICD-10-CM | POA: Diagnosis not present

## 2016-07-04 DIAGNOSIS — Z170 Estrogen receptor positive status [ER+]: Secondary | ICD-10-CM | POA: Diagnosis not present

## 2016-07-04 MED ORDER — ANASTROZOLE 1 MG PO TABS: 1.0000 mg | ORAL_TABLET | Freq: Every day | ORAL | 4 refills | Status: DC

## 2016-07-04 NOTE — Progress Notes (Signed)
Jennifer [name]  [phone] [phone]     ID: Jennifer [name] DOB: Nov 07, 1956  MR#: [id number]  EPP#:[id number]  Patient Care Team: Fayrene Helper, MD as PCP - General Excell Seltzer, MD as Consulting Physician (General Surgery) Chauncey Cruel, MD as Consulting Physician (Oncology) Kyung Rudd, MD as Consulting Physician (Radiation Oncology) Mauro Kaufmann, RN as Registered Nurse Rockwell Germany, RN as Registered Nurse Sylvan Cheese, NP as Nurse Practitioner (Nurse Practitioner) PCP: Tula Nakayama, MD GYN: Gustavo Lah WHNP-BC:  OTHER MD:  CHIEF COMPLAINT: Estrogen receptor positive breast cancer  CURRENT TREATMENT: anastrozole   BREAST CANCER HISTORY: From the original intake note:  Liylah had screening mammography at [facility]/25/2015. This showed a spiculated mass in the left breast and the patient was referred to [facility] where on 05/31/2015 she underwent ultrasonography of the left breast. This showed a 1 cm lobulated mass in the left breast superiorly, measuring 1.0 cm. This was felt to be highly suspicious. Accordingly on 05/31/2015 the patient underwent left breast biopsy showing ([id number]) an invasive ductal carcinoma, grade 1 or 2 , estrogen and progesterone receptor positive, with an MIB-1 of 8, and HER-2 not amplified, the signals ratio being 1.33 and the number per cell 2.85.  Her subsequent history is as detailed below  INTERVAL HISTORY: Jennifer [name] returns today for follow-up of her estrogen receptor positive breast cancer accompanied by her spouse Burman Nieves. Jennifer [name] continues on anastrozole, with generally good tolerance. She has hot flashes "all the time", but they don't wake her up area she does have significant problems with vaginal dryness. She is using lubricants at present. She has not developed the arthralgias or myalgias with this agent. She obtains a drug at a good price..  Since her last visit here Jennifer [name]  underwent a sleeve procedure in [place]. Her weight has dropped from 215 pounds in February of this year to 162 pounds currently.  REVIEW OF SYSTEMS: She is now seeing a psychiatrist and appears more stable as far as her mood is concerned. She walks one of their 2 dogs twice daily for 10 or 20 minutes (the other dog "pulls". She does not otherwise exercise. She feels her left upper extremity is a little bit weaker than the right and both arms are little bit weaker now than they used to be before her surgery. She denies any unusual pain except at the chest wall where she had her surgeries. This is intermittent. She feels forgetful, anxious, and depressed. Of course she has posttraumatic stress disorder. A detailed review of systems today was otherwise noncontributory  PAST MEDICAL HISTORY: Past Medical History:  Diagnosis Date  . ALLERGIC RHINITIS   . Anxiety   . Arthritis    "joints; mostly hands, wrists, elbows, knees" (07/04/2015)  . Bell's palsy June 2016   [facility]  . Breast cancer of upper-outer quadrant of left female breast ([place]) 06/02/2015  . Cataract    beginning to form  . Depression   . Edema of both legs    2+ pitting edema  . Frequency of urination   . GERD (gastroesophageal reflux disease)   . Heart murmur   . Herpes zoster   . Hyperlipidemia   . Hypertension   . Myocardial infarction [facility])    "some indication that I've had a mild MI in my past on my workup for geriatric bypass recently" (07/04/2015)  . Pneumonia    "several times"  . PTSD (post-traumatic stress disorder)  Traumatic insomnia  . Sleep apnea    "mask not registering time I was using it; so insurance company took it back; will start wearing it again" (07/04/2015)  . Type II diabetes mellitus ([place])     PAST SURGICAL HISTORY: Past Surgical History:  Procedure Laterality Date  . ANKLE FRACTURE SURGERY Left 12/23/2008   "pins"  . BARIATRIC SURGERY  2017  . BONE EXOSTOSIS EXCISION   09/12/2011   Procedure: EXOSTOSIS EXCISION;  Surgeon: Marcheta Grammes;  Location: [facility];  Service: Orthopedics;  Laterality: Right;  Retrocalcaneal Exostectomy Right Foot  . BREAST BIOPSY Left 05/2015  . COLONOSCOPY    . FRACTURE SURGERY    . LAPAROSCOPIC CHOLECYSTECTOMY  04/2010  . MASTECTOMY COMPLETE / SIMPLE Right 07/03/2015   PROPHYLACTIC   . MASTECTOMY COMPLETE / SIMPLE W/ SENTINEL NODE BIOPSY Left 07/03/2015  . ORIF TIBIA & FIBULA FRACTURES Left 12/23/2008   "pins"  . SIMPLE MASTECTOMY WITH AXILLARY SENTINEL NODE BIOPSY Left 07/03/2015   Procedure: LEFT TOTAL  MASTECTOMY WITH LEFT  AXILLARY SENTINEL NODE BIOPSY;  Surgeon: Excell Seltzer, MD;  Location: [place];  Service: General;  Laterality: Left;  . TOTAL MASTECTOMY Right 07/03/2015   Procedure: RIGHT TOTAL PROPHYLACTIC MASTECTOMY;  Surgeon: Excell Seltzer, MD;  Location: [place];  Service: General;  Laterality: Right;    FAMILY HISTORY Family History  Problem Relation Age of Onset  . Cancer Mother     lung  . Hypertension Sister     x2  . Colon polyps Sister 61  . Heart disease Brother   . Esophageal cancer Brother   . Hyperlipidemia Brother   . Cancer Brother   . Depression Sister   . Anesthesia problems Neg Hx   . Colon cancer Neg Hx   . Rectal cancer Neg Hx   . Stomach cancer Neg Hx    the patient's father died in an automobile accident the edge of 71. The patient's mother was diagnosed with lung cancer at the age of 30, shortly before her death from that call us. She was a smoker. The patient had 4 brothers, 2 sisters. One brother was diagnosed with esophageal cancer at age 44. There is no history of breast or ovarian cancer in the family.  GYNECOLOGIC HISTORY:  No LMP recorded. Patient is postmenopausal. Menarche age 42, the patient is GX P0. She stopped having periods approximately age 42. She took hormone replacement approximately one year. 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 She married Jennifer [name] 08/15/2009 in [place]. It is just the 2 of them at home, with 2 dogs. They also foster children at times. They used to live in [place], so they have their medical care in this area. 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 The patient is not a church attender    ADVANCED DIRECTIVES: In place   HEALTH MAINTENANCE: Social History  Substance Use Topics  . Smoking status: Never Smoker  . Smokeless tobacco: Never Used  . Alcohol use No     Colonoscopy: SEPT 2013  PAP:  Bone density: never Lipid panel:  Allergies  Allergen Reactions  . Duloxetine Other (See Comments)    REACTION: sxual side effects  . Mushroom Extract Complex Nausea And Vomiting  . Pravastatin Other (See Comments)    Muscle aches    Current Outpatient Prescriptions  Medication Sig Dispense Refill  . alprazolam Jennifer [name]) 2  MG tablet take one tablet at bedtime AS NEEDED FOR SLEEP 30 tablet 3  . anastrozole (ARIMIDEX) 1 MG tablet Take 1 tablet (1 mg total) by mouth daily. 90 tablet 4  . buPROPion (WELLBUTRIN XL) 300 MG 24 hr tablet TAKE 1 TABLET BY MOUTH EVERY MORNING 30 tablet 0  . busPIRone (BUSPAR) 5 MG tablet TAKE 1 TABLET BY MOUTH 3 TIMES A DAY 90 tablet 0  . cholecalciferol (VITAMIN D) 1000 units tablet Take 1,000 Units by mouth daily.    Marland Kitchen ezetimibe (ZETIA) 10 MG tablet TAKE 1 TABLET BY MOUTH EVERY DAY 90 tablet 1  . fluticasone (FLONASE) 50 MCG/ACT nasal spray Place 1 spray into both nostrils daily.    Marland Kitchen gabapentin (NEURONTIN) 300 MG capsule Take 1 capsule (300 mg total) by mouth 4 (four) times daily. 120 capsule 2  . magnesium gluconate (MAGONATE) 500 MG tablet Take 500 mg by mouth 2 (two) times daily.    Marland Kitchen olmesartan (BENICAR) 40 MG tablet TAKE 1 TABLET BY MOUTH EVERY DAY 30 tablet 3  . potassium chloride (K-DUR) 10 MEQ tablet Take 1 tablet (10 mEq total) by mouth daily. (Patient not taking: Reported on 06/07/2016) 14  tablet 0  . traZODone (DESYREL) 100 MG tablet Take 1 tablet By mouth at bedtime 30 tablet 3   No current facility-administered medications for this visit.     OBJECTIVE: Middle-aged white woman Who appears stated age 39:   07/04/16 1258  BP: 123/84  Pulse: 66  Resp: 17  Temp: 98 F (36.7 C)     Body mass index is 27.43 kg/m.    ECOG FS:1 - Symptomatic but completely ambulatory  Sclerae unicteric, pupils round and equal Oropharynx clear and moist-- no thrush or other lesions No cervical or supraclavicular adenopathy Lungs no rales or rhonchi Heart regular rate and rhythm Abd soft, nontender, positive bowel sounds MSK no focal spinal tenderness, no upper extremity lymphedema Neuro: Grip is 5 minus bilaterally, upper extremity strength is 5 minus on the right and 4 on the left, with no sensory changes. The patient is well oriented, appropriate affect Breasts: Status post bilateral mastectomies. There is no evidence of chest wall recurrence. Both axillae are benign.  LAB RESULTS:  CMP     Component Value Date/Time   NA 140 08/24/2015 1444   K 3.5 08/24/2015 1444   CL 99 (L) 07/05/2015 0348   CO2 24 08/24/2015 1444   GLUCOSE 147 (H) 08/24/2015 1444   BUN 13.9 08/24/2015 1444   CREATININE 0.9 08/24/2015 1444   CALCIUM 9.3 08/24/2015 1444   PROT 7.6 08/24/2015 1444   ALBUMIN 3.8 08/24/2015 1444   AST 36 (H) 08/24/2015 1444   ALT 64 (H) 08/24/2015 1444   ALKPHOS 110 08/24/2015 1444   BILITOT 0.43 08/24/2015 1444   GFRNONAA 45 (L) 07/05/2015 0348   GFRNONAA 62 12/13/2013 1230   GFRAA 52 (L) 07/05/2015 0348   GFRAA 71 12/13/2013 1230    INo results found for: SPEP, UPEP  Lab Results  Component Value Date   WBC 7.7 08/24/2015   NEUTROABS 4.6 08/24/2015   HGB 13.7 08/24/2015   HCT 42.3 08/24/2015   MCV 94.6 08/24/2015   PLT 311 08/24/2015      Chemistry      Component Value Date/Time   NA 140 08/24/2015 1444   K 3.5 08/24/2015 1444   CL 99 (L) 07/05/2015  0348   CO2 24 08/24/2015 1444   BUN 13.9 08/24/2015 1444   CREATININE  0.9 08/24/2015 1444      Component Value Date/Time   CALCIUM 9.3 08/24/2015 1444   ALKPHOS 110 08/24/2015 1444   AST 36 (H) 08/24/2015 1444   ALT 64 (H) 08/24/2015 1444   BILITOT 0.43 08/24/2015 1444       No results found for: LABCA2  No components found for: LABCA125  No results for input(s): INR in the last 168 hours.  Urinalysis    Component Value Date/Time   COLORURINE YELLOW 04/25/2010 2211   APPEARANCEUR CLEAR 04/25/2010 2211   LABSPEC >1.030 (H) 04/25/2010 2211   PHURINE 6.0 04/25/2010 2211   GLUCOSEU NEGATIVE 04/25/2010 2211   GLUCOSEU NEGATIVE 12/30/2007 1520   HGBUR TRACE (A) 04/25/2010 2211   BILIRUBINUR NEGATIVE 04/25/2010 2211   KETONESUR TRACE (A) 04/25/2010 2211   PROTEINUR TRACE (A) 04/25/2010 2211   UROBILINOGEN 0.2 04/25/2010 2211   NITRITE NEGATIVE 04/25/2010 2211   LEUKOCYTESUR NEGATIVE 04/25/2010 2211    STUDIES: No results found.  ASSESSMENT: 61 y.o. [place], [place] woman status post left breast upper outer quadrant biopsy 05/31/2015 for a clinical T1b N0, stage IA  Invasive ductal carcinoma, low to intermediate grade, estrogen and progesterone receptor positive, with HER-2 not amplified, and an MIB-1 of 8%  (1) status post bilateral mastectomies and left sided sentinel lymph node sampling 07/03/2015, showing  (a) on the right, no atypia or malignancy  (b) on the left, a pT2 pN0, stage IIA Invasive ductal carcinoma, grade 2 repeat HER-2 again negative   (2) Oncotype score of 21 predicts a 10 year risk of recurrence outside the breast of 14% if the patient's only systemic therapy is tamoxifen for 5 years.  (a) after appropriate discussion the patient opted against chemotherapy given the very marginal benefit predicted  (3) anastrozole started October 2016  (a) bone density 11/09/2015 was normal  PLAN: Jennifer [name] is now just about a year into anastrozole with good tolerance.  Hot flashes of course continue to be a problem as his vaginal dryness. He is willing however to continue on this medication until she completes 5 years.  She does have mild measurable decrease in strength in the left upper extremity as compared to the right. I suggested getting plain films of her neck today and then if those are normal aside expect refer to a neurosurgeon who can do further evaluation and probably refer her to physical therapy. She agreed this was a very reasonable plan but she doesn't want anything to do with it.  She did agree to perhaps joined a [facility] locally and get physical therapy there. If she had stopped the correct exercises to do than the tragus to start with very low weights and be very persistent. She should be able to rehabilitation herself with a little bit of direction if she does that.  Otherwise she is considering reconstruction and will be meeting with Dr.thimmappa later today. She will be seeing Dr. Excell Seltzer in December.  I suggested she see me in March, but she did not quite want to get a follow-up appointment at this point. She tells me she will call later. I suggested she can see me any time February March or April and would be convenient for her  The overall plan remains to continue on anastrozole for a total of 5 years. She will need a repeat bone density December 2018.   Chauncey Cruel, MD   07/04/2016 2:06 PM Medical Oncology and Hematology [facility] [address] [place],  [address] Tel. [phone]  Fax. [phone] [place]

## 2016-08-01 ENCOUNTER — Other Ambulatory Visit: Payer: Self-pay | Admitting: *Deleted

## 2016-08-01 DIAGNOSIS — C50412 Malignant neoplasm of upper-outer quadrant of left female breast: Secondary | ICD-10-CM

## 2016-08-02 ENCOUNTER — Other Ambulatory Visit: Payer: Self-pay | Admitting: Family Medicine

## 2016-08-16 ENCOUNTER — Other Ambulatory Visit: Payer: Self-pay | Admitting: Family Medicine

## 2016-09-24 ENCOUNTER — Other Ambulatory Visit: Payer: Self-pay | Admitting: Family Medicine

## 2016-10-02 ENCOUNTER — Other Ambulatory Visit: Payer: Self-pay | Admitting: Family Medicine

## 2016-10-09 ENCOUNTER — Telehealth: Payer: Self-pay | Admitting: *Deleted

## 2016-10-09 NOTE — Telephone Encounter (Signed)
"  I'm having a problem with prescriptions.  The last two to three weeks I've had nerve pain and general body aches like the flu.  Symptoms have developed more and gotten worse the past week.  I take two gabapentin for this pain and also on anastrozole.  Both have the same side effects so I wonder is there something the Gabapentin can be changed to because I feel bad all over with headache, body aches and weak all over, droopy, sleepy slight nausea."  Return number [phone]

## 2016-10-10 NOTE — Telephone Encounter (Signed)
This RN returned call and obtained identified VM - message left requesting a return call to discuss her concerns .

## 2016-10-21 ENCOUNTER — Other Ambulatory Visit: Payer: Self-pay | Admitting: Family Medicine

## 2016-10-25 ENCOUNTER — Other Ambulatory Visit: Payer: Self-pay | Admitting: Family Medicine

## 2016-11-13 ENCOUNTER — Ambulatory Visit: Payer: BLUE CROSS/BLUE SHIELD | Admitting: Family Medicine

## 2016-11-15 ENCOUNTER — Other Ambulatory Visit: Payer: Self-pay | Admitting: *Deleted

## 2016-11-15 MED ORDER — GABAPENTIN 300 MG PO CAPS: 300.0000 mg | ORAL_CAPSULE | Freq: Four times a day (QID) | ORAL | 2 refills | Status: DC

## 2016-11-16 ENCOUNTER — Encounter: Payer: Self-pay | Admitting: Family Medicine

## 2016-11-18 ENCOUNTER — Telehealth: Payer: Self-pay | Admitting: Family Medicine

## 2016-11-18 NOTE — Telephone Encounter (Signed)
Sin ce unable to see the message pls contact pt and let me know what the message is please, thanks

## 2016-11-19 ENCOUNTER — Ambulatory Visit: Payer: BLUE CROSS/BLUE SHIELD | Admitting: Family Medicine

## 2016-11-25 ENCOUNTER — Other Ambulatory Visit: Payer: Self-pay | Admitting: Family Medicine

## 2016-11-25 ENCOUNTER — Telehealth: Payer: Self-pay | Admitting: Family Medicine

## 2016-11-25 NOTE — Telephone Encounter (Signed)
Noted.  Patient with appt 2/22

## 2016-11-25 NOTE — Telephone Encounter (Signed)
Refill request for ONLY one month, pt needs to be seen in the office, pls let her know, will need to bring her f/u appt a bit sooner I believe also, Questions, pls ask

## 2016-11-25 NOTE — Telephone Encounter (Signed)
Needs only 30 day supply and will need oV within the next 30 days

## 2016-11-25 NOTE — Telephone Encounter (Signed)
Please advise if these can be refilled  

## 2016-12-07 ENCOUNTER — Other Ambulatory Visit: Payer: Self-pay | Admitting: Family Medicine

## 2016-12-10 ENCOUNTER — Telehealth: Payer: Self-pay | Admitting: Adult Health

## 2016-12-10 NOTE — Telephone Encounter (Signed)
The Survivorship Care Plan was mailed to Jennifer [name] as she reported not being able to come in to the Survivorship Clinic for an in-person visit at this time. A letter was mailed to her outlining the purpose of the content of the care plan, as well as encouraging her to reach out to me with any questions or concerns.  .  Additional resources regarding healthy eating, recommendations for exercise, LIVESTR[employer] program, and brochures for support services were also included in the mailed packet.  A shorter version of the care plan was also routed/faxed/mailed to Jennifer Nakayama, MD, the patient's PCP.  I will not be placing any follow-up appointments to the Survivorship Clinic for Jennifer [name], but I am happy to see her at any time in the future for any survivorship concerns that may arise. Thank you for allowing me to participate in her care!  Charlestine Massed, NP [place] [phone]

## 2016-12-24 ENCOUNTER — Other Ambulatory Visit: Payer: Self-pay | Admitting: Family Medicine

## 2016-12-31 ENCOUNTER — Other Ambulatory Visit: Payer: Self-pay

## 2016-12-31 ENCOUNTER — Encounter: Payer: Self-pay | Admitting: Family Medicine

## 2017-01-02 ENCOUNTER — Ambulatory Visit (INDEPENDENT_AMBULATORY_CARE_PROVIDER_SITE_OTHER): Payer: BLUE CROSS/BLUE SHIELD | Admitting: Family Medicine

## 2017-01-02 ENCOUNTER — Encounter: Payer: Self-pay | Admitting: Family Medicine

## 2017-01-02 VITALS — BP 114/74 | HR 63 | Resp 15 | Ht 66.0 in | Wt 139.0 lb

## 2017-01-02 DIAGNOSIS — F418 Other specified anxiety disorders: Secondary | ICD-10-CM | POA: Diagnosis not present

## 2017-01-02 DIAGNOSIS — I10 Essential (primary) hypertension: Secondary | ICD-10-CM

## 2017-01-02 DIAGNOSIS — F5104 Psychophysiologic insomnia: Secondary | ICD-10-CM | POA: Diagnosis not present

## 2017-01-02 DIAGNOSIS — E559 Vitamin D deficiency, unspecified: Secondary | ICD-10-CM

## 2017-01-02 DIAGNOSIS — R7302 Impaired glucose tolerance (oral): Secondary | ICD-10-CM

## 2017-01-02 DIAGNOSIS — E785 Hyperlipidemia, unspecified: Secondary | ICD-10-CM | POA: Diagnosis not present

## 2017-01-02 NOTE — Patient Instructions (Addendum)
F/U in September, call if you need me before  CONGRATS on excellent labs , and weight loss  I will send you a message regarding lab updates  Thank you  for choosing [facility]. We consider it a privelige to serve you.  Delivering excellent health care in a caring and  compassionate way is our goal.  Partnering with you,  so that together we can achieve this goal is our strategy.   We will send a message regarding updated labs  It is important that you exercise regularly at least 30 minutes 5 times a week. If you develop chest pain, have severe difficulty breathing, or feel very tired, stop exercising immediately and seek medical attention

## 2017-01-03 ENCOUNTER — Encounter: Payer: Self-pay | Admitting: Family Medicine

## 2017-01-03 NOTE — Progress Notes (Signed)
Jennifer [name]     MRN: [id number]      DOB: 1956-08-30   HPI Jennifer [name] is here for follow up and re-evaluation of chronic medical conditions, medication management and review of any available recent lab and radiology data.  Preventive health is updated, specifically  Cancer screening and Immunization.   Has upcoming breast re construction surgery. Has benefited significantly from therapy, no longer experiencing PTSD and flash backs, no longer seeing therapist The PT denies any adverse reactions to current medications since the last visit.  There are no new concerns.  There are no specific complaints   ROS Denies recent fever or chills. Denies sinus pressure, nasal congestion, ear pain or sore throat. Denies chest congestion, productive cough or wheezing. Denies chest pains, palpitations and leg swelling Denies abdominal pain, nausea, vomiting,diarrhea or constipation.   Denies dysuria, frequency, hesitancy or incontinence. C/o upper back pain , hopes this will improve with reconstruction , uses a brace Denies headaches, seizures, numbness, or tingling. Denies depression, anxiety or insomnia. Denies skin break down or rash.   PE  BP 114/74   Pulse 63   Resp 15   Ht 5\' 6"  (1.676 m)   Wt 139 lb (63 kg)   SpO2 100%   BMI 22.44 kg/m   Patient alert and oriented and in no cardiopulmonary distress.  HEENT: No facial asymmetry, EOMI,   oropharynx pink and moist.  Neck supple no JVD, no mass.  Chest: Clear to auscultation bilaterally.  CVS: S1, S2 no murmurs, no S3.Regular rate.  ABD: Soft non tender.   Ext: No edema  MS: Adequate ROM spine, shoulders, hips and knees.  Skin: Intact, no ulcerations or rash noted.  Psych: Good eye contact, normal affect. Memory intact not anxious or depressed appearing.  CNS: CN 2-12 intact, power,  normal throughout.no focal deficits noted.   Assessment & Plan  Essential hypertension Controlled, no change in medication DASH diet  and commitment to daily physical activity for a minimum of 30 minutes discussed and encouraged, as a part of hypertension management. The importance of attaining a healthy weight is also discussed.  BP/Weight 01/02/2017 07/04/2016 06/07/2016 12/19/2015 11/09/2015 08/24/2015 [id number]  Systolic BP [id number] 123456 0000000 0000000 Q000111Q  Diastolic BP 74 84 82 73 82 95 63  Wt. (Lbs) 139 162.3 170 215.6 211 219.8 -  BMI 22.44 27.43 28.73 34.82 34.07 35.49 -       IGT (impaired glucose tolerance) Updated lab needed at/ before next visit. Patient educated about the importance of limiting  Carbohydrate intake , the need to commit to daily physical activity for a minimum of 30 minutes , and to commit weight loss. The fact that changes in all these areas will reduce or eliminate all together the development of diabetes is stressed.   Diabetic Labs Latest Ref Rng & Units 06/03/2016 11/09/2015 08/24/2015 07/05/2015 07/04/2015  HbA1c - 5.7 - - - -  Microalbumin Not estab mg/dL - 1.0 - - -  Micro/Creat Ratio <30 mcg/mg creat - 6 - - -  Chol 0 - 200 mg/dL - - - - -  HDL >39 mg/dL - - - - -  Calc LDL mg/dL 127 - - - -  Triglycerides <150 mg/dL - - - - -  Creatinine 0.6 - 1.1 mg/dL - - 0.9 1.28(H) 1.06(H)   BP/Weight 01/02/2017 07/04/2016 06/07/2016 12/19/2015 11/09/2015 08/24/2015 [id number]  Systolic BP 99991111 [id number] 123456 123456 0000000 0000000 Q000111Q  Diastolic BP 74 84 82  73 82 95 63  Wt. (Lbs) 139 162.3 170 215.6 211 219.8 -  BMI 22.44 27.43 28.73 34.82 34.07 35.49 -   Foot/eye exam completion dates 11/09/2015 06/20/2014  Foot Form Completion Done Done      Depression with anxiety Controlled, no change in medication Reports excellent response to electromagnetic therapy  Hyperlipidemia LDL goal <100 Improved Hyperlipidemia:Low fat diet discussed and encouraged.   Recent labs scanned      Insomnia Sleep hygiene reviewed and written information offered also. Prescription sent for  medication needed.   Vitamin D  deficiency Updated lab needed at/ before next visit.

## 2017-01-03 NOTE — Assessment & Plan Note (Signed)
Controlled, no change in medication DASH diet and commitment to daily physical activity for a minimum of 30 minutes discussed and encouraged, as a part of hypertension management. The importance of attaining a healthy weight is also discussed.  BP/Weight 01/02/2017 07/04/2016 06/07/2016 12/19/2015 11/09/2015 08/24/2015 Q000111Q  Systolic BP 99991111 [id number] 123456 123456 0000000 0000000 Q000111Q  Diastolic BP 74 84 82 73 82 95 63  Wt. (Lbs) 139 162.3 170 215.6 211 219.8 -  BMI 22.44 27.43 28.73 34.82 34.07 35.49 -

## 2017-01-03 NOTE — Assessment & Plan Note (Signed)
Improved Hyperlipidemia:Low fat diet discussed and encouraged.   Recent labs scanned

## 2017-01-03 NOTE — Assessment & Plan Note (Signed)
Updated lab needed at/ before next visit.   

## 2017-01-03 NOTE — Assessment & Plan Note (Signed)
Updated lab needed at/ before next visit. Patient educated about the importance of limiting  Carbohydrate intake , the need to commit to daily physical activity for a minimum of 30 minutes , and to commit weight loss. The fact that changes in all these areas will reduce or eliminate all together the development of diabetes is stressed.   Diabetic Labs Latest Ref Rng & Units 06/03/2016 11/09/2015 08/24/2015 07/05/2015 07/04/2015  HbA1c - 5.7 - - - -  Microalbumin Not estab mg/dL - 1.0 - - -  Micro/Creat Ratio <30 mcg/mg creat - 6 - - -  Chol 0 - 200 mg/dL - - - - -  HDL >39 mg/dL - - - - -  Calc LDL mg/dL 127 - - - -  Triglycerides <150 mg/dL - - - - -  Creatinine 0.6 - 1.1 mg/dL - - 0.9 1.28(H) 1.06(H)   BP/Weight 01/02/2017 07/04/2016 06/07/2016 12/19/2015 11/09/2015 08/24/2015 Q000111Q  Systolic BP 99991111 [id number] 123456 123456 0000000 0000000 Q000111Q  Diastolic BP 74 84 82 73 82 95 63  Wt. (Lbs) 139 162.3 170 215.6 211 219.8 -  BMI 22.44 27.43 28.73 34.82 34.07 35.49 -   Foot/eye exam completion dates 11/09/2015 06/20/2014  Foot Form Completion Done Done

## 2017-01-03 NOTE — Assessment & Plan Note (Signed)
Sleep hygiene reviewed and written information offered also. Prescription sent for  medication needed.  

## 2017-01-03 NOTE — Assessment & Plan Note (Signed)
Controlled, no change in medication Reports excellent response to electromagnetic therapy

## 2017-01-06 ENCOUNTER — Encounter: Payer: Self-pay | Admitting: Family Medicine

## 2017-01-08 ENCOUNTER — Telehealth: Payer: Self-pay | Admitting: Family Medicine

## 2017-01-08 ENCOUNTER — Other Ambulatory Visit: Payer: Self-pay | Admitting: Family Medicine

## 2017-01-08 ENCOUNTER — Encounter: Payer: Self-pay | Admitting: Family Medicine

## 2017-01-08 DIAGNOSIS — E785 Hyperlipidemia, unspecified: Secondary | ICD-10-CM

## 2017-01-08 MED ORDER — PITAVASTATIN CALCIUM 2 MG PO TABS: 2.0000 mg | ORAL_TABLET | Freq: Every day | ORAL | 5 refills | Status: DC

## 2017-01-08 NOTE — Telephone Encounter (Signed)
Please mail a lab order for fasting lipid and hepatic panel to patient to have this drawn in 3  Months, lets say 2nd week in Jn[place], thanks, she is starting med for hyperlipidemia

## 2017-01-10 NOTE — Telephone Encounter (Signed)
Mailed orders to patient.

## 2017-01-15 ENCOUNTER — Other Ambulatory Visit: Payer: Self-pay | Admitting: Family Medicine

## 2017-01-15 ENCOUNTER — Telehealth: Payer: Self-pay | Admitting: Family Medicine

## 2017-01-15 ENCOUNTER — Encounter: Payer: Self-pay | Admitting: Family Medicine

## 2017-01-15 NOTE — Telephone Encounter (Signed)
Pls let pt know that her ins requires she try a 2nd statin before they would cover the one ( livalo) that I had mentioned. I have entered lipitor pls send. Also let her know to call back if problems with it, it is the LOWEST dose  Also let her know that we are thankful she has done well with recent surgery

## 2017-01-17 ENCOUNTER — Other Ambulatory Visit: Payer: Self-pay

## 2017-01-17 MED ORDER — ATORVASTATIN CALCIUM 10 MG PO TABS: 10.0000 mg | ORAL_TABLET | Freq: Every day | ORAL | 5 refills | Status: DC

## 2017-01-17 NOTE — Telephone Encounter (Signed)
Sent via [employer] and med has been sent in

## 2017-01-19 ENCOUNTER — Encounter: Payer: Self-pay | Admitting: Family Medicine

## 2017-02-20 ENCOUNTER — Encounter: Payer: Self-pay | Admitting: Family Medicine

## 2017-02-22 ENCOUNTER — Other Ambulatory Visit: Payer: Self-pay | Admitting: Family Medicine

## 2017-03-16 ENCOUNTER — Other Ambulatory Visit: Payer: Self-pay | Admitting: Oncology

## 2017-03-17 ENCOUNTER — Telehealth: Payer: Self-pay

## 2017-03-17 NOTE — Telephone Encounter (Signed)
Rx filled for 1 month. LVM on home phone for pt to call and make an appt

## 2017-03-17 NOTE — Telephone Encounter (Signed)
lvm that pt needs to call [facility] for an appt. Dr Magrinat's note said Feb/March/April at her convenience.  Refilled gabapentin for 1 month only.

## 2017-03-20 ENCOUNTER — Other Ambulatory Visit: Payer: Self-pay | Admitting: Oncology

## 2017-03-24 ENCOUNTER — Ambulatory Visit: Payer: BLUE CROSS/BLUE SHIELD | Admitting: Oncology

## 2017-03-28 ENCOUNTER — Telehealth: Payer: Self-pay | Admitting: Oncology

## 2017-03-28 NOTE — Telephone Encounter (Signed)
Patient called and said she needed to cancel her next appointment on 5/21 and she would call back to reschedule

## 2017-03-31 ENCOUNTER — Ambulatory Visit: Payer: BLUE CROSS/BLUE SHIELD | Admitting: Oncology

## 2017-04-25 ENCOUNTER — Other Ambulatory Visit: Payer: Self-pay | Admitting: Family Medicine

## 2017-04-25 ENCOUNTER — Other Ambulatory Visit: Payer: Self-pay

## 2017-04-25 MED ORDER — ALPRAZOLAM 2 MG PO TABS: 2.0000 mg | ORAL_TABLET | Freq: Every evening | ORAL | 3 refills | Status: DC | PRN

## 2017-06-02 NOTE — Progress Notes (Signed)
Jennifer [name]  [phone] [phone]) [phone]     ID: Jennifer [name] DOB: 08/18/1956  MR#: [id number]  FTD#:[id number]  Patient Care Team: Fayrene Helper, MD as PCP - General Excell Seltzer, MD as Consulting Physician (General Surgery) Matthe Sloane, Virgie Dad, MD as Consulting Physician (Oncology) Kyung Rudd, MD as Consulting Physician (Radiation Oncology) Mauro Kaufmann, RN as Registered Nurse Rockwell Germany, RN as Registered Nurse Causey, Charlestine Massed, NP as Nurse Practitioner (Hematology and Oncology) PCP: Fayrene Helper, MD GYN: Gustavo Lah WHNP-BC:  OTHER MD:  CHIEF COMPLAINT: Estrogen receptor positive breast cancer  CURRENT TREATMENT: anastrozole   BREAST CANCER HISTORY: From the original intake note:  Jennifer [name] had screening mammography at [facility] 05/25/2015. This showed a spiculated mass in the left breast and the patient was referred to [facility] where on 05/31/2015 she underwent ultrasonography of the left breast. This showed a 1 cm lobulated mass in the left breast superiorly, measuring 1.0 cm. This was felt to be highly suspicious. Accordingly on 05/31/2015 the patient underwent left breast biopsy showing ([id number]) an invasive ductal carcinoma, grade 1 or 2 , estrogen and progesterone receptor positive, with an MIB-1 of 8, and HER-2 not amplified, the signals ratio being 1.33 and the number per cell 2.85.  Her subsequent history is as detailed below  INTERVAL HISTORY: Jennifer [name] returns today for follow-up and treatment of her estrogen receptor positive breast cancer, accompanied by her spouse Jennifer [name]. Jennifer [name] continues on anastrozole, with good tolerance. Hot flashes and vaginal dryness are not a major issue. She never developed the arthralgias or myalgias that many patients can experience on this medication. She obtains it at a good price. Marland Kitchen  REVIEW OF SYSTEMS: Jennifer [name] continues to benefit from her earlier sleeve procedure and has an  excellent weight at this point. She also had bilateral reconstruction under Dr. Colvin Caroli in [place]. She has not been entirely satisfied because she developed a seroma on the right than what we call dog ears bilaterally. These were resolved. She tells me she will not be able to have tattoos because her skin is too thin. Overall however she feels she is doing "pretty well". She tells me the gabapentin posterior and a mental Jennifer [name] and she is considering stopping it. It does help the pain however. A detailed review of systems today was otherwise stable.  PAST MEDICAL HISTORY: Past Medical History:  Diagnosis Date  . ALLERGIC RHINITIS   . Anxiety   . Arthritis    "joints; mostly hands, wrists, elbows, knees" (07/04/2015)  . Bell's palsy June 2016   [facility]  . Breast cancer of upper-outer quadrant of left female breast ([place]) 06/02/2015  . Cataract    beginning to form  . Depression   . Edema of both legs    2+ pitting edema  . Frequency of urination   . GERD (gastroesophageal reflux disease)   . Heart murmur   . Herpes zoster   . Hyperlipidemia   . Hypertension   . Myocardial infarction    "some indication that I've had a mild MI in my past on my workup for geriatric bypass recently" (07/04/2015)  . Pneumonia    "several times"  . PTSD (post-traumatic stress disorder)    Traumatic insomnia  . Sleep apnea    "mask not registering time I was using it; so insurance company took it back; will start wearing it again" (07/04/2015)  . Type II diabetes mellitus ([place])  PAST SURGICAL HISTORY: Past Surgical History:  Procedure Laterality Date  . ANKLE FRACTURE SURGERY Left 12/23/2008   "pins"  . BARIATRIC SURGERY  2017  . BONE EXOSTOSIS EXCISION  09/12/2011   Procedure: EXOSTOSIS EXCISION;  Surgeon: Marcheta Grammes;  Location: [facility];  Service: Orthopedics;  Laterality: Right;  Retrocalcaneal Exostectomy Right Foot  . BREAST BIOPSY Left 05/2015  . COLONOSCOPY     . FRACTURE SURGERY    . LAPAROSCOPIC CHOLECYSTECTOMY  04/2010  . MASTECTOMY COMPLETE / SIMPLE Right 07/03/2015   PROPHYLACTIC   . MASTECTOMY COMPLETE / SIMPLE W/ SENTINEL NODE BIOPSY Left 07/03/2015  . ORIF TIBIA & FIBULA FRACTURES Left 12/23/2008   "pins"  . SIMPLE MASTECTOMY WITH AXILLARY SENTINEL NODE BIOPSY Left 07/03/2015   Procedure: LEFT TOTAL  MASTECTOMY WITH LEFT  AXILLARY SENTINEL NODE BIOPSY;  Surgeon: Excell Seltzer, MD;  Location: [place];  Service: General;  Laterality: Left;  . TOTAL MASTECTOMY Right 07/03/2015   Procedure: RIGHT TOTAL PROPHYLACTIC MASTECTOMY;  Surgeon: Excell Seltzer, MD;  Location: [place];  Service: General;  Laterality: Right;    FAMILY HISTORY Family History  Problem Relation Age of Onset  . Cancer Mother        lung  . Hypertension Sister        x2  . Colon polyps Sister 60  . Heart disease Brother   . Esophageal cancer Brother   . Hyperlipidemia Brother   . Cancer Brother   . Depression Sister   . Anesthesia problems Neg Hx   . Colon cancer Neg Hx   . Rectal cancer Neg Hx   . Stomach cancer Neg Hx    the patient's father died in an automobile accident the edge of 85. The patient's mother was diagnosed with lung cancer at the age of 24, shortly before her death from that call us. She was a smoker. The patient had 4 brothers, 2 sisters. One brother was diagnosed with esophageal cancer at age 27. There is no history of breast or ovarian cancer in the family.  GYNECOLOGIC HISTORY:  No LMP recorded. Patient is postmenopausal. Menarche age 20, the patient is GX P0. She stopped having periods approximately age 52. She took hormone replacement approximately one year. 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 She married Jennifer [name] 08/15/2009 in [place]. It is just the 2 of them at home, with 2 dogs. They also foster children at times. They used to live in [place], so they  have their medical care in this area. 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 The patient is not a church attender    ADVANCED DIRECTIVES: In place   HEALTH MAINTENANCE: Social History  Substance Use Topics  . Smoking status: Never Smoker  . Smokeless tobacco: Never Used  . Alcohol use No     Colonoscopy: SEPT 2013  PAP:  Bone density: never Lipid panel:  Allergies  Allergen Reactions  . Duloxetine Other (See Comments)    REACTION: sxual side effects  . Mushroom Extract Complex Nausea And Vomiting  . Pravastatin Other (See Comments)    Muscle aches    Current Outpatient Prescriptions  Medication Sig Dispense Refill  . alprazolam (XANAX) 2 MG tablet Take 1 tablet (2 mg total) by mouth at bedtime as needed. for sleep 30 tablet 3  . anastrozole (ARIMIDEX) 1 MG tablet Take 1 tablet (1 mg total) by mouth  daily. 90 tablet 4  . atorvastatin (LIPITOR) 10 MG tablet Take 1 tablet (10 mg total) by mouth daily. 30 tablet 5  . buPROPion (WELLBUTRIN XL) 300 MG 24 hr tablet TAKE 1 TABLET BY MOUTH EVERY MORNING 30 tablet 3  . busPIRone (BUSPAR) 5 MG tablet TAKE 1 TABLET BY MOUTH 3 TIMES A DAY 270 tablet 1  . cholecalciferol (VITAMIN D) 1000 units tablet Take 1,000 Units by mouth daily.    . fluticasone (FLONASE) 50 MCG/ACT nasal spray Place 1 spray into both nostrils daily.    Marland Kitchen gabapentin (NEURONTIN) 100 MG capsule Take 1-2 capsules (100-200 mg total) by mouth 3 (three) times daily. 60 capsule 3  . gabapentin (NEURONTIN) 300 MG capsule Take 1 capsule (300 mg total) by mouth 4 (four) times daily. Pt needs an appt with Dr Jana Hakim, 120 capsule 0  . magnesium gluconate (MAGONATE) 500 MG tablet Take 500 mg by mouth 2 (two) times daily.    Marland Kitchen olmesartan (BENICAR) 40 MG tablet TAKE 1 TABLET BY MOUTH EVERY DAY 90 tablet 1  . traMADol (ULTRAM) 50 MG tablet Take 1-2 tablets (50-100 mg total) by mouth every 6 (six) hours as needed. 60 tablet 4  . traZODone (DESYREL) 100 MG  tablet TAKE ONE TABLET BY MOUTH at bedtime 30 tablet 3   No current facility-administered medications for this visit.     OBJECTIVE: Middle-aged white womanIn no acute distress  Vitals:   06/03/17 1407  BP: (!) 140/55  Pulse: (!) 59  Resp: 18  Temp: 98.2 F (36.8 C)     Body mass index is 22.73 kg/m.    ECOG FS:1 - Symptomatic but completely ambulatory  Sclerae unicteric, EOMs intact Oropharynx clear and moist No cervical or supraclavicular adenopathy Lungs no rales or rhonchi Heart regular rate and rhythm Abd soft, nontender, positive bowel sounds MSK no focal spinal tenderness, no upper extremity lymphedema Neuro: nonfocal, well oriented, appropriate affect Breasts: Status post bilateral mastectomies with implant reconstruction. The cosmetic result is good. There is no evidence of local recurrence. Both axillae are benign.  LAB RESULTS:  CMP     Component Value Date/Time   NA 140 08/24/2015 1444   K 3.5 08/24/2015 1444   CL 99 (L) 07/05/2015 0348   CO2 24 08/24/2015 1444   GLUCOSE 147 (H) 08/24/2015 1444   BUN 13.9 08/24/2015 1444   CREATININE 0.9 08/24/2015 1444   CALCIUM 9.3 08/24/2015 1444   PROT 7.6 08/24/2015 1444   ALBUMIN 3.8 08/24/2015 1444   AST 36 (H) 08/24/2015 1444   ALT 64 (H) 08/24/2015 1444   ALKPHOS 110 08/24/2015 1444   BILITOT 0.43 08/24/2015 1444   GFRNONAA 45 (L) 07/05/2015 0348   GFRNONAA 62 12/13/2013 1230   GFRAA 52 (L) 07/05/2015 0348   GFRAA 71 12/13/2013 1230    INo results found for: SPEP, UPEP  Lab Results  Component Value Date   WBC 7.7 08/24/2015   NEUTROABS 4.6 08/24/2015   HGB 13.7 08/24/2015   HCT 42.3 08/24/2015   MCV 94.6 08/24/2015   PLT 311 08/24/2015      Chemistry      Component Value Date/Time   NA 140 08/24/2015 1444   K 3.5 08/24/2015 1444   CL 99 (L) 07/05/2015 0348   CO2 24 08/24/2015 1444   BUN 13.9 08/24/2015 1444   CREATININE 0.9 08/24/2015 1444      Component Value Date/Time   CALCIUM 9.3  08/24/2015 1444   ALKPHOS 110 08/24/2015 1444  AST 36 (H) 08/24/2015 1444   ALT 64 (H) 08/24/2015 1444   BILITOT 0.43 08/24/2015 1444       No results found for: LABCA2  No components found for: [id number]  No results for input(s): INR in the last 168 hours.  Urinalysis    Component Value Date/Time   COLORURINE YELLOW 04/25/2010 2211   APPEARANCEUR CLEAR 04/25/2010 2211   LABSPEC >1.030 (H) 04/25/2010 2211   PHURINE 6.0 04/25/2010 2211   GLUCOSEU NEGATIVE 04/25/2010 2211   GLUCOSEU NEGATIVE 12/30/2007 1520   HGBUR TRACE (A) 04/25/2010 2211   BILIRUBINUR NEGATIVE 04/25/2010 2211   KETONESUR TRACE (A) 04/25/2010 2211   PROTEINUR TRACE (A) 04/25/2010 2211   UROBILINOGEN 0.2 04/25/2010 2211   NITRITE NEGATIVE 04/25/2010 2211   LEUKOCYTESUR NEGATIVE 04/25/2010 2211    STUDIES: No results found.  ASSESSMENT: 61 y.o. [place], [place] woman status post left breast upper outer quadrant biopsy 05/31/2015 for a clinical T1b N0, stage IA  Invasive ductal carcinoma, low to intermediate grade, estrogen and progesterone receptor positive, with HER-2 not amplified, and an MIB-1 of 8%  (1) status post bilateral mastectomies and left sided sentinel lymph node sampling 07/03/2015, showing  (a) on the right, no atypia or malignancy  (b) on the left, a pT2 pN0, stage IIA Invasive ductal carcinoma, grade 2 repeat HER-2 again negative   (2) Oncotype score of 21 predicts a 10 year risk of recurrence outside the breast of 14% if the patient's only systemic therapy is tamoxifen for 5 years.  (a) after appropriate discussion the patient opted against chemotherapy given the very marginal benefit predicted  (3) anastrozole started October 2016  (a) bone density 11/09/2015 was normal  PLAN: Kaislee Is now 2 years out from definitive surgery for her breast cancer with no evidence of disease recurrence. This is very favorable.  She is not entirely satisfied with her reconstruction but in my view it  is cosmetically very acceptable. She understands the extra bits of tissue that were removed from her axilla do make those areas more cosmetically acceptable but may restrict her range of motion. She will need to exercise both arms to maximal range of motion very gradually and gently but also very persistently.  Otherwise she is tolerating anastrozole generally well and the plan will be to continue that for total of 5 years.  We started with a normal DEXA scan. We will repeat that this December.  Many patients do feel like she doesn't add gabapentin makes him feel woozy. I wrote her for the 100 mg dose and she will take that instead of recurrence 300 mg dose. If that is acceptable from a side effect point of view but is not effective from a pain control point of view she will go to 200 mg and give that a try. I also wrote her for Ultram, tramadol to try if the gabapentin proves unremarkable.   Otherwise she will see me again in one year. She knows to call for any problems that may develop before then.   Chauncey Cruel, MD   06/03/2017 8:58 PM Medical Oncology and Hematology [facility] [address] [place], [place] [address] Tel. [phone]    Fax. [phone] [place]

## 2017-06-03 ENCOUNTER — Ambulatory Visit (HOSPITAL_BASED_OUTPATIENT_CLINIC_OR_DEPARTMENT_OTHER): Payer: BLUE CROSS/BLUE SHIELD | Admitting: Oncology

## 2017-06-03 VITALS — BP 140/55 | HR 59 | Temp 98.2°F | Resp 18 | Ht 66.0 in | Wt 140.8 lb

## 2017-06-03 DIAGNOSIS — Z170 Estrogen receptor positive status [ER+]: Secondary | ICD-10-CM

## 2017-06-03 DIAGNOSIS — Z79811 Long term (current) use of aromatase inhibitors: Secondary | ICD-10-CM

## 2017-06-03 DIAGNOSIS — C50412 Malignant neoplasm of upper-outer quadrant of left female breast: Secondary | ICD-10-CM | POA: Diagnosis not present

## 2017-06-03 MED ORDER — GABAPENTIN 100 MG PO CAPS: 100.0000 mg | ORAL_CAPSULE | Freq: Three times a day (TID) | ORAL | 3 refills | Status: DC

## 2017-06-03 MED ORDER — TRAMADOL HCL 50 MG PO TABS: 50.0000 mg | ORAL_TABLET | Freq: Four times a day (QID) | ORAL | 4 refills | Status: DC | PRN

## 2017-06-15 ENCOUNTER — Other Ambulatory Visit: Payer: Self-pay | Admitting: Family Medicine

## 2017-07-08 ENCOUNTER — Other Ambulatory Visit: Payer: Self-pay | Admitting: Oncology

## 2017-08-09 ENCOUNTER — Other Ambulatory Visit: Payer: Self-pay | Admitting: Family Medicine

## 2017-08-12 ENCOUNTER — Other Ambulatory Visit: Payer: Self-pay | Admitting: Family Medicine

## 2017-08-12 MED ORDER — BUSPIRONE HCL 5 MG PO TABS: 5.0000 mg | ORAL_TABLET | Freq: Three times a day (TID) | ORAL | 0 refills | Status: DC

## 2017-08-15 ENCOUNTER — Encounter: Payer: Self-pay | Admitting: Family Medicine

## 2017-08-15 ENCOUNTER — Other Ambulatory Visit: Payer: Self-pay | Admitting: Family Medicine

## 2017-08-15 NOTE — Telephone Encounter (Signed)
Seen 2 22 18

## 2017-08-19 ENCOUNTER — Other Ambulatory Visit: Payer: Self-pay | Admitting: Family Medicine

## 2017-08-19 MED ORDER — ALPRAZOLAM 2 MG PO TABS: ORAL_TABLET | ORAL | 1 refills | Status: DC

## 2017-08-19 NOTE — Progress Notes (Signed)
Xanax

## 2017-08-20 DIAGNOSIS — Z9889 Other specified postprocedural states: Secondary | ICD-10-CM | POA: Diagnosis not present

## 2017-08-26 ENCOUNTER — Encounter: Payer: Self-pay | Admitting: Gastroenterology

## 2017-08-29 ENCOUNTER — Encounter: Payer: Self-pay | Admitting: Family Medicine

## 2017-09-03 ENCOUNTER — Other Ambulatory Visit: Payer: Self-pay | Admitting: Family Medicine

## 2017-09-17 ENCOUNTER — Encounter: Payer: Self-pay | Admitting: Family Medicine

## 2017-09-18 ENCOUNTER — Telehealth: Payer: Self-pay

## 2017-09-18 DIAGNOSIS — I10 Essential (primary) hypertension: Secondary | ICD-10-CM

## 2017-09-18 DIAGNOSIS — E785 Hyperlipidemia, unspecified: Secondary | ICD-10-CM

## 2017-09-18 DIAGNOSIS — R7302 Impaired glucose tolerance (oral): Secondary | ICD-10-CM

## 2017-09-18 NOTE — Telephone Encounter (Signed)
Lab ordered.

## 2017-09-23 ENCOUNTER — Other Ambulatory Visit: Payer: Self-pay | Admitting: Family Medicine

## 2017-09-23 MED ORDER — TRAZODONE HCL 100 MG PO TABS: 100.0000 mg | ORAL_TABLET | Freq: Every day | ORAL | 0 refills | Status: DC

## 2017-09-23 MED ORDER — BUPROPION HCL ER (XL) 300 MG PO TB24: 300.0000 mg | ORAL_TABLET | Freq: Every morning | ORAL | 0 refills | Status: DC

## 2017-09-30 DIAGNOSIS — I10 Essential (primary) hypertension: Secondary | ICD-10-CM | POA: Diagnosis not present

## 2017-09-30 DIAGNOSIS — E785 Hyperlipidemia, unspecified: Secondary | ICD-10-CM | POA: Diagnosis not present

## 2017-09-30 DIAGNOSIS — R7302 Impaired glucose tolerance (oral): Secondary | ICD-10-CM | POA: Diagnosis not present

## 2017-10-01 LAB — COMPLETE METABOLIC PANEL WITH GFR
AG Ratio: 1.7 (calc) (ref 1.0–2.5)
ALBUMIN MSPROF: 4 g/dL (ref 3.6–5.1)
ALKALINE PHOSPHATASE (APISO): 67 U/L (ref 33–130)
ALT: 12 U/L (ref 6–29)
AST: 19 U/L (ref 10–35)
BUN / CREAT RATIO: 20 (calc) (ref 6–22)
BUN: 21 mg/dL (ref 7–25)
CALCIUM: 9.3 mg/dL (ref 8.6–10.4)
CO2: 29 mmol/L (ref 20–32)
CREATININE: 1.07 mg/dL — AB (ref 0.50–0.99)
Chloride: 104 mmol/L (ref 98–110)
GFR, EST AFRICAN AMERICAN: 65 mL/min/{1.73_m2} (ref >=60)
GFR, Est Non African American: 56 mL/min/{1.73_m2} — ABNORMAL LOW (ref >=60)
GLUCOSE: 85 mg/dL (ref 65–99)
Globulin: 2.3 g/dL (calc) (ref 1.9–3.7)
Potassium: 4.3 mmol/L (ref 3.5–5.3)
Sodium: 140 mmol/L (ref 135–146)
TOTAL PROTEIN: 6.3 g/dL (ref 6.1–8.1)
Total Bilirubin: 0.5 mg/dL (ref 0.2–1.2)

## 2017-10-01 LAB — LIPID PANEL
CHOL/HDL RATIO: 3.3 (calc) (ref <5.0)
CHOLESTEROL: 247 mg/dL — AB (ref <200)
HDL: 75 mg/dL (ref >50)
LDL CHOLESTEROL (CALC): 150 mg/dL — AB
Non-HDL Cholesterol (Calc): 172 mg/dL (calc) — ABNORMAL HIGH (ref <130)
Triglycerides: 108 mg/dL (ref <150)

## 2017-10-01 LAB — HEMOGLOBIN A1C
Hgb A1c MFr Bld: 5.3 % of total Hgb (ref <5.7)
MEAN PLASMA GLUCOSE: 105 (calc)
eAG (mmol/L): 5.8 (calc)

## 2017-10-01 LAB — TSH: TSH: 2.45 m[IU]/L (ref 0.40–4.50)

## 2017-10-09 ENCOUNTER — Ambulatory Visit (INDEPENDENT_AMBULATORY_CARE_PROVIDER_SITE_OTHER): Payer: BLUE CROSS/BLUE SHIELD | Admitting: Family Medicine

## 2017-10-09 ENCOUNTER — Encounter: Payer: Self-pay | Admitting: Family Medicine

## 2017-10-09 VITALS — BP 158/84 | HR 84 | Resp 16 | Ht 66.0 in | Wt 136.0 lb

## 2017-10-09 DIAGNOSIS — I10 Essential (primary) hypertension: Secondary | ICD-10-CM | POA: Diagnosis not present

## 2017-10-09 DIAGNOSIS — F418 Other specified anxiety disorders: Secondary | ICD-10-CM | POA: Diagnosis not present

## 2017-10-09 DIAGNOSIS — E785 Hyperlipidemia, unspecified: Secondary | ICD-10-CM | POA: Diagnosis not present

## 2017-10-09 DIAGNOSIS — R7302 Impaired glucose tolerance (oral): Secondary | ICD-10-CM

## 2017-10-09 DIAGNOSIS — F5104 Psychophysiologic insomnia: Secondary | ICD-10-CM

## 2017-10-09 MED ORDER — ATORVASTATIN CALCIUM 10 MG PO TABS: 10.0000 mg | ORAL_TABLET | Freq: Every day | ORAL | 5 refills | Status: DC

## 2017-10-09 MED ORDER — AMLODIPINE BESYLATE 2.5 MG PO TABS: 2.5000 mg | ORAL_TABLET | Freq: Every day | ORAL | 1 refills | Status: DC

## 2017-10-09 MED ORDER — TRAZODONE HCL 100 MG PO TABS: 100.0000 mg | ORAL_TABLET | Freq: Every day | ORAL | 5 refills | Status: DC

## 2017-10-09 MED ORDER — BUSPIRONE HCL 5 MG PO TABS: 5.0000 mg | ORAL_TABLET | Freq: Three times a day (TID) | ORAL | 1 refills | Status: DC

## 2017-10-09 MED ORDER — ALPRAZOLAM 2 MG PO TABS: ORAL_TABLET | ORAL | 5 refills | Status: DC

## 2017-10-09 MED ORDER — OLMESARTAN MEDOXOMIL 40 MG PO TABS: 40.0000 mg | ORAL_TABLET | Freq: Every day | ORAL | 1 refills | Status: DC

## 2017-10-09 NOTE — Patient Instructions (Signed)
Nurse bP check in 6 weeks  mD follow up in 6 months  BP is high, new additional medication is amlodipine 2.5 mg one daily  You will be referred for a bone density test  All the best  Thank you  for choosing [facility]. We consider it a privelige to serve you.  Delivering excellent health care in a caring and  compassionate way is our goal.  Partnering with you,  so that together we can achieve this goal is our strategy.

## 2017-10-20 ENCOUNTER — Encounter: Payer: Self-pay | Admitting: Family Medicine

## 2017-10-20 ENCOUNTER — Other Ambulatory Visit: Payer: Self-pay | Admitting: Family Medicine

## 2017-10-20 DIAGNOSIS — Z780 Asymptomatic menopausal state: Secondary | ICD-10-CM

## 2017-10-20 DIAGNOSIS — D120 Benign neoplasm of cecum: Secondary | ICD-10-CM
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
## 2017-10-20 NOTE — Assessment & Plan Note (Signed)
Controlled on current medication, continue same 

## 2017-10-20 NOTE — Assessment & Plan Note (Signed)
Sleep hygiene reviewed and written information offered also. Prescription sent for  medication needed.  

## 2017-10-20 NOTE — Assessment & Plan Note (Addendum)
Normalized following bariatric surgery, HBA1C of 5.3 in 09/2017

## 2017-10-20 NOTE — Assessment & Plan Note (Addendum)
Hyperlipidemia:Low fat diet discussed and encouraged.   Lipid Panel  Lab Results  Component Value Date   CHOL 247 (H) 09/30/2017   HDL 75 09/30/2017   LDLCALC 127 06/03/2016   LDLDIRECT 201 (H) 05/21/2013   TRIG 108 09/30/2017   CHOLHDL 3.3 09/30/2017   Patient is statin and all lipid lowering medication intolerant intolerant , will work on dietary chane

## 2017-10-20 NOTE — Assessment & Plan Note (Signed)
Needs to be treated. Start amlodipine, nurse BP check in 6 weeks DASH diet and commitment to daily physical activity for a minimum of 30 minutes discussed and encouraged, as a part of hypertension management. The importance of attaining a healthy weight is also discussed.  BP/Weight 10/09/2017 06/03/2017 01/02/2017 07/04/2016 06/07/2016 12/19/2015 67/61/9509  Systolic BP 326 712 458 099 833 825 053  Diastolic BP 84 55 74 84 82 73 82  Wt. (Lbs) 136 140.8 139 162.3 170 215.6 211  BMI 21.95 22.73 22.44 27.43 28.73 34.82 34.07

## 2017-10-27 ENCOUNTER — Telehealth: Payer: Self-pay | Admitting: Family Medicine

## 2017-10-27 ENCOUNTER — Other Ambulatory Visit: Payer: Self-pay

## 2017-10-27 MED ORDER — ANASTROZOLE 1 MG PO TABS: 1.0000 mg | ORAL_TABLET | Freq: Every day | ORAL | 1 refills | Status: DC

## 2017-10-27 NOTE — Telephone Encounter (Signed)
[facility]i [facility] is calling and they need a Refill Request for Jennifer [name]  For Anastrozole 1 mg --[phone]

## 2017-10-27 NOTE — Telephone Encounter (Signed)
Refill sent.

## 2017-10-28 NOTE — Addendum Note (Signed)
Addended by: Eual Fines on: 10/28/2017 11:25 AM   Modules accepted: Orders

## 2017-11-05 ENCOUNTER — Encounter: Payer: Self-pay | Admitting: Family Medicine

## 2017-11-06 ENCOUNTER — Encounter: Payer: Self-pay | Admitting: Family Medicine

## 2017-11-10 ENCOUNTER — Ambulatory Visit (HOSPITAL_COMMUNITY)
Admission: RE | Admit: 2017-11-10 | Discharge: 2017-11-10 | Disposition: A | Discharge status: Home / Self Care | Payer: BLUE CROSS/BLUE SHIELD | Source: Ambulatory Visit | Attending: Family Medicine | Admitting: Family Medicine

## 2017-11-10 DIAGNOSIS — M85852 Other specified disorders of bone density and structure, left thigh: Secondary | ICD-10-CM | POA: Diagnosis not present

## 2017-11-10 DIAGNOSIS — Z13820 Encounter for screening for osteoporosis: Secondary | ICD-10-CM | POA: Insufficient documentation

## 2017-11-10 DIAGNOSIS — Z780 Asymptomatic menopausal state: Secondary | ICD-10-CM | POA: Insufficient documentation

## 2017-11-13 ENCOUNTER — Other Ambulatory Visit (HOSPITAL_COMMUNITY): Payer: BLUE CROSS/BLUE SHIELD

## 2017-11-14 ENCOUNTER — Encounter: Payer: Self-pay | Admitting: Family Medicine

## 2017-12-04 ENCOUNTER — Encounter: Payer: Self-pay | Admitting: Oncology

## 2017-12-15 ENCOUNTER — Telehealth: Payer: Self-pay | Admitting: Oncology

## 2017-12-15 NOTE — Telephone Encounter (Signed)
Scheduled appt per 1/31 sch msg - left voicemail for patient regarding appts.

## 2017-12-26 ENCOUNTER — Other Ambulatory Visit: Payer: Self-pay

## 2017-12-26 MED ORDER — BUPROPION HCL ER (XL) 300 MG PO TB24: 300.0000 mg | ORAL_TABLET | Freq: Every morning | ORAL | 3 refills | Status: DC

## 2017-12-26 NOTE — Progress Notes (Signed)
This encounter was created in error - please disregard.

## 2017-12-29 ENCOUNTER — Encounter: Payer: Self-pay | Admitting: Family Medicine

## 2017-12-30 ENCOUNTER — Other Ambulatory Visit: Payer: Self-pay

## 2017-12-30 MED ORDER — BUPROPION HCL ER (XL) 300 MG PO TB24: 300.0000 mg | ORAL_TABLET | Freq: Every morning | ORAL | 1 refills | Status: DC
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
## 2018-01-13 ENCOUNTER — Inpatient Hospital Stay: Payer: BLUE CROSS/BLUE SHIELD

## 2018-01-13 ENCOUNTER — Inpatient Hospital Stay: Payer: BLUE CROSS/BLUE SHIELD | Attending: Oncology | Admitting: Oncology

## 2018-01-13 VITALS — BP 155/72 | HR 52 | Temp 97.9°F | Resp 18 | Ht 66.0 in | Wt 134.8 lb

## 2018-01-13 DIAGNOSIS — C50412 Malignant neoplasm of upper-outer quadrant of left female breast: Secondary | ICD-10-CM | POA: Insufficient documentation

## 2018-01-13 DIAGNOSIS — Z170 Estrogen receptor positive status [ER+]: Secondary | ICD-10-CM | POA: Insufficient documentation

## 2018-01-13 LAB — CBC WITH DIFFERENTIAL/PLATELET
BASOS PCT: 0 %
Basophils Absolute: 0 10*3/uL (ref 0.0–0.1)
EOS ABS: 0.1 10*3/uL (ref 0.0–0.5)
Eosinophils Relative: 1 %
HEMATOCRIT: 36.7 % (ref 34.8–46.6)
HEMOGLOBIN: 11.9 g/dL (ref 11.6–15.9)
Lymphocytes Relative: 39 %
Lymphs Abs: 2.8 10*3/uL (ref 0.9–3.3)
MCH: 30.8 pg (ref 25.1–34.0)
MCHC: 32.4 g/dL (ref 31.5–36.0)
MCV: 95.1 fL (ref 79.5–101.0)
Monocytes Absolute: 0.6 10*3/uL (ref 0.1–0.9)
Monocytes Relative: 8 %
NEUTROS ABS: 3.6 10*3/uL (ref 1.5–6.5)
NEUTROS PCT: 52 %
Platelets: 268 10*3/uL (ref 145–400)
RBC: 3.86 MIL/uL (ref 3.70–5.45)
RDW: 12.8 % (ref 11.2–14.5)
WBC: 7.1 10*3/uL (ref 3.9–10.3)

## 2018-01-13 LAB — COMPREHENSIVE METABOLIC PANEL
ALBUMIN: 3.8 g/dL (ref 3.5–5.0)
ALK PHOS: 82 U/L (ref 40–150)
ALT: 12 U/L (ref 0–55)
AST: 20 U/L (ref 5–34)
Anion gap: 9 (ref 3–11)
BILIRUBIN TOTAL: 0.4 mg/dL (ref 0.2–1.2)
BUN: 21 mg/dL (ref 7–26)
CALCIUM: 9.6 mg/dL (ref 8.4–10.4)
CO2: 27 mmol/L (ref 22–29)
CREATININE: 0.93 mg/dL (ref 0.60–1.10)
Chloride: 104 mmol/L (ref 98–109)
GFR calc Af Amer: >60 mL/min (ref >60)
GFR calc non Af Amer: >60 mL/min (ref >60)
GLUCOSE: 94 mg/dL (ref 70–140)
Potassium: 4 mmol/L (ref 3.5–5.1)
SODIUM: 140 mmol/L (ref 136–145)
Total Protein: 7.1 g/dL (ref 6.4–8.3)

## 2018-01-13 MED ORDER — TAMOXIFEN CITRATE 20 MG PO TABS: 20.0000 mg | ORAL_TABLET | Freq: Every day | ORAL | 12 refills | Status: AC

## 2018-01-14 ENCOUNTER — Telehealth: Payer: Self-pay | Admitting: Oncology

## 2018-01-14 NOTE — Telephone Encounter (Signed)
Per 3/5 no los °

## 2018-01-19 ENCOUNTER — Encounter: Payer: Self-pay | Admitting: Family Medicine

## 2018-01-19 ENCOUNTER — Encounter: Payer: Self-pay | Admitting: Oncology

## 2018-01-29 ENCOUNTER — Other Ambulatory Visit: Payer: Self-pay

## 2018-02-05 ENCOUNTER — Encounter: Payer: Self-pay | Admitting: Oncology

## 2018-02-17 ENCOUNTER — Encounter: Payer: Self-pay | Admitting: Oncology

## 2018-02-20 ENCOUNTER — Telehealth: Payer: Self-pay | Admitting: *Deleted

## 2018-02-20 MED ORDER — TAMOXIFEN CITRATE 20 MG PO TABS: 20.0000 mg | ORAL_TABLET | Freq: Every day | ORAL | 3 refills | Status: DC

## 2018-02-20 NOTE — Telephone Encounter (Signed)
This RN spoke with pt today per noted mychart message sent late March - and then her call today. Verified with MD's dictation need for tamoxifen to be sent to pharmacy.  Verified and prescription sent per this phone discussion.

## 2018-03-08 ENCOUNTER — Encounter: Payer: Self-pay | Admitting: Family Medicine

## 2018-04-07 ENCOUNTER — Encounter: Payer: Self-pay | Admitting: Family Medicine

## 2018-04-07 ENCOUNTER — Telehealth: Payer: Self-pay

## 2018-04-07 DIAGNOSIS — E785 Hyperlipidemia, unspecified: Secondary | ICD-10-CM

## 2018-04-07 DIAGNOSIS — I10 Essential (primary) hypertension: Secondary | ICD-10-CM

## 2018-04-07 DIAGNOSIS — E559 Vitamin D deficiency, unspecified: Secondary | ICD-10-CM

## 2018-04-07 DIAGNOSIS — E8881 Metabolic syndrome: Secondary | ICD-10-CM

## 2018-04-07 NOTE — Telephone Encounter (Signed)
Patient labs mailed

## 2018-04-08 ENCOUNTER — Ambulatory Visit: Payer: BLUE CROSS/BLUE SHIELD | Admitting: Family Medicine

## 2018-04-20 DIAGNOSIS — E8881 Metabolic syndrome: Secondary | ICD-10-CM | POA: Diagnosis not present

## 2018-04-20 DIAGNOSIS — E785 Hyperlipidemia, unspecified: Secondary | ICD-10-CM | POA: Diagnosis not present

## 2018-04-20 DIAGNOSIS — I10 Essential (primary) hypertension: Secondary | ICD-10-CM | POA: Diagnosis not present

## 2018-04-21 ENCOUNTER — Encounter: Payer: Self-pay | Admitting: Family Medicine

## 2018-04-21 LAB — LIPID PANEL
CHOL/HDL RATIO: 2.8 (calc) (ref <5.0)
Cholesterol: 214 mg/dL — ABNORMAL HIGH (ref <200)
HDL: 76 mg/dL (ref >50)
LDL CHOLESTEROL (CALC): 117 mg/dL — AB
Non-HDL Cholesterol (Calc): 138 mg/dL (calc) — ABNORMAL HIGH (ref <130)
TRIGLYCERIDES: 100 mg/dL (ref <150)

## 2018-04-21 LAB — COMPLETE METABOLIC PANEL WITH GFR
AG RATIO: 1.5 (calc) (ref 1.0–2.5)
ALT: 10 U/L (ref 6–29)
AST: 18 U/L (ref 10–35)
Albumin: 3.8 g/dL (ref 3.6–5.1)
Alkaline phosphatase (APISO): 66 U/L (ref 33–130)
BILIRUBIN TOTAL: 0.5 mg/dL (ref 0.2–1.2)
BUN: 20 mg/dL (ref 7–25)
CHLORIDE: 102 mmol/L (ref 98–110)
CO2: 31 mmol/L (ref 20–32)
Calcium: 9.1 mg/dL (ref 8.6–10.4)
Creat: 0.88 mg/dL (ref 0.50–0.99)
GFR, EST AFRICAN AMERICAN: 82 mL/min/{1.73_m2} (ref >=60)
GFR, Est Non African American: 71 mL/min/{1.73_m2} (ref >=60)
GLOBULIN: 2.6 g/dL (ref 1.9–3.7)
Glucose, Bld: 78 mg/dL (ref 65–99)
POTASSIUM: 3.6 mmol/L (ref 3.5–5.3)
SODIUM: 139 mmol/L (ref 135–146)
TOTAL PROTEIN: 6.4 g/dL (ref 6.1–8.1)

## 2018-04-21 LAB — HEMOGLOBIN A1C
Hgb A1c MFr Bld: 5.1 % of total Hgb (ref <5.7)
Mean Plasma Glucose: 100 (calc)
eAG (mmol/L): 5.5 (calc)

## 2018-04-21 LAB — TSH: TSH: 2.09 mIU/L (ref 0.40–4.50)

## 2018-04-30 ENCOUNTER — Ambulatory Visit: Payer: BLUE CROSS/BLUE SHIELD | Admitting: Family Medicine

## 2018-05-21 ENCOUNTER — Ambulatory Visit (INDEPENDENT_AMBULATORY_CARE_PROVIDER_SITE_OTHER): Payer: BLUE CROSS/BLUE SHIELD | Admitting: Family Medicine

## 2018-05-21 ENCOUNTER — Encounter: Payer: Self-pay | Admitting: Family Medicine

## 2018-05-21 VITALS — BP 150/90 | HR 68 | Resp 16 | Ht 64.75 in | Wt 135.0 lb

## 2018-05-21 DIAGNOSIS — F5104 Psychophysiologic insomnia: Secondary | ICD-10-CM | POA: Diagnosis not present

## 2018-05-21 DIAGNOSIS — F418 Other specified anxiety disorders: Secondary | ICD-10-CM | POA: Diagnosis not present

## 2018-05-21 DIAGNOSIS — I10 Essential (primary) hypertension: Secondary | ICD-10-CM | POA: Diagnosis not present

## 2018-05-21 DIAGNOSIS — E785 Hyperlipidemia, unspecified: Secondary | ICD-10-CM | POA: Diagnosis not present

## 2018-05-21 MED ORDER — FLUOXETINE HCL 10 MG PO TABS: 10.0000 mg | ORAL_TABLET | Freq: Every day | ORAL | 5 refills | Status: DC

## 2018-05-21 MED ORDER — AMLODIPINE BESYLATE 5 MG PO TABS: 5.0000 mg | ORAL_TABLET | Freq: Every day | ORAL | 5 refills | Status: DC

## 2018-05-21 NOTE — Patient Instructions (Addendum)
F/U in 2.5 to   3 months call if you need me before  Your blood pressure is elevated so you need to start medication amlodipine once daily  Start additional medication fluoxetine 10 mg once daily for depression, continue Wellbutrin  And trazodone  Please send the name of the Dr for your colonoscopy  Continue taking good care of yourself and look into the health care situation

## 2018-05-31 ENCOUNTER — Encounter: Payer: Self-pay | Admitting: Family Medicine

## 2018-05-31 NOTE — Assessment & Plan Note (Signed)
Uncontrolled, amlodipine 5 mg daily added DASH diet and commitment to daily physical activity for a minimum of 30 minutes discussed and encouraged, as a part of hypertension management. The importance of attaining a healthy weight is also discussed. F/U in 2 months  BP/Weight 05/21/2018 01/13/2018 10/09/2017 06/03/2017 01/02/2017 07/04/2016 8/36/7255  Systolic BP 001 642 903 795 583 167 425  Diastolic BP 90 72 84 55 74 84 82  Wt. (Lbs) 135 134.8 136 140.8 139 162.3 170  BMI 22.64 21.76 21.95 22.73 22.44 27.43 28.73

## 2018-05-31 NOTE — Assessment & Plan Note (Signed)
Add fluoxetine low dose. Tele psychiatry offered, no interest currently had identified a therapist where she moved from that she had good interaction with I mentioned the strong possibility that she may benefit form Psychiatry managing her medication, she was not very keen at this time She is no physical threat to herself or anyone, hopefully she will both tolerate and respond to fluoxetine positvely

## 2018-05-31 NOTE — Assessment & Plan Note (Signed)
Hyperlipidemia:Low fat diet discussed and encouraged.   Lipid Panel  Lab Results  Component Value Date   CHOL 214 (H) 04/20/2018   HDL 76 04/20/2018   LDLCALC 117 (H) 04/20/2018   LDLDIRECT 201 (H) 05/21/2013   TRIG 100 04/20/2018   CHOLHDL 2.8 04/20/2018   Marked improvement with change in diet

## 2018-05-31 NOTE — Assessment & Plan Note (Signed)
Sleep hygiene reviewed and written information offered also. Prescription sent for  medication needed.  

## 2018-05-31 NOTE — Progress Notes (Signed)
   Jennifer [name]     MRN: [id number]      DOB: 1956/02/17   HPI Jennifer [name] is here for follow up and re-evaluation of chronic medical conditions, medication management and review of any available recent lab and radiology data.  Preventive health is updated,  She needs her colonoscopy and will call back with the name of the Doctor she chooses Questions or concerns regarding consultations or procedures which the PT has had in the interim are  addressed. She voluntarily discontinued medication prescribed by oncology for her breast cancer , and has not tried the alternative presented and has elected not to do so C/o inadequately treated depression and would like her medication addressed  ROS Denies recent fever or chills. Denies sinus pressure, nasal congestion, ear pain or sore throat. Denies chest congestion, productive cough or wheezing. Denies chest pains, palpitations and leg swelling Denies abdominal pain, nausea, vomiting,diarrhea or constipation.   Denies dysuria, frequency, hesitancy or incontinence. Denies joint pain, swelling and limitation in mobility. Denies headaches, seizures, numbness, or tingling. Denies skin break down or rash.   PE  BP (!) 150/90   Pulse 68   Resp 16   Ht 5' 4.75" (1.645 m)   Wt 135 lb (61.2 kg)   SpO2 99%   BMI 22.64 kg/m   Patient alert and oriented and in no cardiopulmonary distress.  HEENT: No facial asymmetry, EOMI,   oropharynx pink and moist.  Neck supple no JVD, no mass.  Chest: Clear to auscultation bilaterally.  CVS: S1, S2 no murmurs, no S3.Regular rate.  ABD: Soft non tender.   Ext: No edema  MS: Adequate ROM spine, shoulders, hips and knees.  Skin: Intact, no ulcerations or rash noted.  Psych: Good eye contact, flat affect. Memory intact not anxious mildly  depressed appearing.  CNS: CN 2-12 intact, power,  normal throughout.no focal deficits noted.   Assessment & Plan  Depression with anxiety Add fluoxetine low  dose. Tele psychiatry offered, no interest currently had identified a therapist where she moved from that she had good interaction with I mentioned the strong possibility that she may benefit form Psychiatry managing her medication, she was not very keen at this time She is no physical threat to herself or anyone, hopefully she will both tolerate and respond to fluoxetine positvely  Essential hypertension Uncontrolled, amlodipine 5 mg daily added DASH diet and commitment to daily physical activity for a minimum of 30 minutes discussed and encouraged, as a part of hypertension management. The importance of attaining a healthy weight is also discussed. F/U in 2 months  BP/Weight 05/21/2018 01/13/2018 10/09/2017 06/03/2017 01/02/2017 07/04/2016 6/72/0947  Systolic BP 096 283 662 947 654 650 354  Diastolic BP 90 72 84 55 74 84 82  Wt. (Lbs) 135 134.8 136 140.8 139 162.3 170  BMI 22.64 21.76 21.95 22.73 22.44 27.43 28.73       Hyperlipidemia LDL goal <100 Hyperlipidemia:Low fat diet discussed and encouraged.   Lipid Panel  Lab Results  Component Value Date   CHOL 214 (H) 04/20/2018   HDL 76 04/20/2018   LDLCALC 117 (H) 04/20/2018   LDLDIRECT 201 (H) 05/21/2013   TRIG 100 04/20/2018   CHOLHDL 2.8 04/20/2018   Marked improvement with change in diet    Insomnia Sleep hygiene reviewed and written information offered also. Prescription sent for  medication needed.

## 2018-06-02 NOTE — Progress Notes (Signed)
[facility]  [phone] [phone]) [phone]     ID: Jennifer [name] DOB: 05-18-1956  MR#: [id number]  JKK#:[id number]  Patient Care Team: Fayrene Helper, MD as PCP - General Excell Seltzer, MD as Consulting Physician (General Surgery) Magrinat, Virgie Dad, MD as Consulting Physician (Oncology) Kyung Rudd, MD as Consulting Physician (Radiation Oncology) Mauro Kaufmann, RN as Registered Nurse Rockwell Germany, RN as Registered Nurse Causey, Charlestine Massed, NP as Nurse Practitioner (Hematology and Oncology) PCP: Fayrene Helper, MD GYN: Gustavo Lah WHNP-BC:  OTHER MD:  CHIEF COMPLAINT: Estrogen receptor positive breast cancer  CURRENT TREATMENT: Observation   BREAST CANCER HISTORY: From the original intake note:  [name] had screening mammography at [facility] 05/25/2015. This showed a spiculated mass in the left breast and the patient was referred to [facility] where on 05/31/2015 she underwent ultrasonography of the left breast. This showed a 1 cm lobulated mass in the left breast superiorly, measuring 1.0 cm. This was felt to be highly suspicious. Accordingly on 05/31/2015 the patient underwent left breast biopsy showing ([id number]) an invasive ductal carcinoma, grade 1 or 2 , estrogen and progesterone receptor positive, with an MIB-1 of 8, and HER-2 not amplified, the signals ratio being 1.33 and the number per cell 2.85.  Her subsequent history is as detailed below  INTERVAL HISTORY: Jennifer [name] returns today for follow-up and treatment of her estrogen receptor positive breast cancer. She continues on Tamoxifen, and she notes that she is doing "terrible" on this medications. She notes that her memory loss came on quicker and worsen than previously before. She stopped taking Tamoxifen the 2nd week of June 2019 and she notes that her memory is ''cloudy". She notes rare hot flashes and reports that she has vaginal dryness and not an increase in vaginal  wetness.     REVIEW OF SYSTEMS: Jennifer [name] reports that for exercise, she has been painting the walls of her new home (white walls, green floors, doors/trimming in an off-white) as well as completing yard work (gardening). She denies fumes from the paint and notes that she wears a mask. She is taking benecor and norvasc. She notes that she has had some GI symptoms with bowel issues. She denies unusual headaches, visual changes, nausea, vomiting, or dizziness. There has been no unusual cough, phlegm production, or pleurisy. This been no change in bowel or bladder habits. She denies unexplained fatigue or unexplained weight loss, bleeding, rash, or fever. A detailed review of systems was otherwise stable.      PAST MEDICAL HISTORY: Past Medical History:  Diagnosis Date  . ALLERGIC RHINITIS   . Anxiety   . Arthritis    "joints; mostly hands, wrists, elbows, knees" (07/04/2015)  . Bell's palsy June 2016   [facility]  . Breast cancer of upper-outer quadrant of left female breast ([place]) 06/02/2015  . Cataract    beginning to form  . Depression   . Edema of both legs    2+ pitting edema  . Frequency of urination   . GERD (gastroesophageal reflux disease)   . Heart murmur   . Herpes zoster   . Hyperlipidemia   . Hypertension   . Myocardial infarction [facility])    "some indication that I've had a mild MI in my past on my workup for geriatric bypass recently" (07/04/2015)  . Pneumonia    "several times"  . PTSD (post-traumatic stress disorder)    Traumatic insomnia  . Sleep apnea    "  mask not registering time I was using it; so insurance company took it back; will start wearing it again" (07/04/2015)  . Type II diabetes mellitus ([place])     PAST SURGICAL HISTORY: Past Surgical History:  Procedure Laterality Date  . ANKLE FRACTURE SURGERY Left 12/23/2008   "pins"  . BARIATRIC SURGERY  2017  . BONE EXOSTOSIS EXCISION  09/12/2011   Procedure: EXOSTOSIS EXCISION;  Surgeon: Marcheta Grammes;  Location: [facility];  Service: Orthopedics;  Laterality: Right;  Retrocalcaneal Exostectomy Right Foot  . BREAST BIOPSY Left 05/2015  . COLONOSCOPY    . FRACTURE SURGERY    . LAPAROSCOPIC CHOLECYSTECTOMY  04/2010  . MASTECTOMY COMPLETE / SIMPLE Right 07/03/2015   PROPHYLACTIC   . MASTECTOMY COMPLETE / SIMPLE W/ SENTINEL NODE BIOPSY Left 07/03/2015  . ORIF TIBIA & FIBULA FRACTURES Left 12/23/2008   "pins"  . SIMPLE MASTECTOMY WITH AXILLARY SENTINEL NODE BIOPSY Left 07/03/2015   Procedure: LEFT TOTAL  MASTECTOMY WITH LEFT  AXILLARY SENTINEL NODE BIOPSY;  Surgeon: Excell Seltzer, MD;  Location: [facility];  Service: General;  Laterality: Left;  . TOTAL MASTECTOMY Right 07/03/2015   Procedure: RIGHT TOTAL PROPHYLACTIC MASTECTOMY;  Surgeon: Excell Seltzer, MD;  Location: [facility];  Service: General;  Laterality: Right;    FAMILY HISTORY Family History  Problem Relation Age of Onset  . Cancer Mother        lung  . Hypertension Sister        x2  . Colon polyps Sister 21  . Heart disease Brother   . Esophageal cancer Brother   . Hyperlipidemia Brother   . Cancer Brother   . Depression Sister   . Anesthesia problems Neg Hx   . Colon cancer Neg Hx   . Rectal cancer Neg Hx   . Stomach cancer Neg Hx    the patient's father died in an automobile accident the edge of 42. The patient's mother was diagnosed with lung cancer at the age of 56, shortly before her death from that call us. She was a smoker. The patient had 4 brothers, 2 sisters. One brother was diagnosed with esophageal cancer at age 9. There is no history of breast or ovarian cancer in the family.  GYNECOLOGIC HISTORY:  No LMP recorded. Patient is postmenopausal. Menarche age 35, the patient is GX P0. She stopped having periods approximately age 38. She took hormone replacement approximately one year. 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 She married [name] 08/15/2009 in [place]. It is just the 2 of them at home, with 2 dogs. They also foster children at times. They used to live in [place], so they have their medical care in this area. 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 The patient is not a church attender    ADVANCED DIRECTIVES: In place   HEALTH MAINTENANCE: Social History   Tobacco Use  . Smoking status: Never Smoker  . Smokeless tobacco: Never Used  Substance Use Topics  . Alcohol use: No  . Drug use: No     Colonoscopy: SEPT 2013  PAP:  Bone density: never Lipid panel:  Allergies  Allergen Reactions  . Duloxetine Other (See Comments)    REACTION: sxual side effects  . Mushroom Extract Complex Nausea And Vomiting  . Pravastatin Other (See Comments)    Muscle aches    Current Outpatient Medications  Medication Sig Dispense  Refill  . alprazolam (XANAX) 2 MG tablet One tablet at bedtime for sleep 30 tablet 5  . amLODipine (NORVASC) 5 MG tablet Take 1 tablet (5 mg total) by mouth daily. 30 tablet 5  . buPROPion (WELLBUTRIN XL) 300 MG 24 hr tablet Take 1 tablet (300 mg total) by mouth every morning. 90 tablet 1  . cholecalciferol (VITAMIN D) 1000 units tablet Take 1,000 Units by mouth daily.    Marland Kitchen FLUoxetine (PROZAC) 10 MG tablet Take 1 tablet (10 mg total) by mouth daily. 30 tablet 5  . fluticasone (FLONASE) 50 MCG/ACT nasal spray Place 1 spray into both nostrils daily.    . magnesium gluconate (MAGONATE) 500 MG tablet Take 500 mg by mouth 2 (two) times daily.    . tamoxifen (NOLVADEX) 20 MG tablet Take 1 tablet (20 mg total) by mouth daily. 90 tablet 12  . traZODone (DESYREL) 100 MG tablet Take 1 tablet (100 mg total) by mouth at bedtime. 30 tablet 5   No current facility-administered medications for this visit.     OBJECTIVE: Middle-aged white woman in no acute distress  Vitals:   06/03/18 1345  BP: (!) 157/74  Pulse: (!) 54  Resp: 18  Temp: 98 F (36.7 C)  SpO2: 100%      Body mass index is 22.77 kg/m.    ECOG FS:1 - Symptomatic but completely ambulatory  Sclerae unicteric, EOMs intact Oropharynx clear and moist No cervical or supraclavicular adenopathy Lungs no rales or rhonchi Heart regular rate and rhythm Abd soft, nontender, positive bowel sounds MSK no focal spinal tenderness, no upper extremity lymphedema Neuro: nonfocal, well oriented, appropriate affect Breasts: There is post bilateral mastectomies, with bilateral reconstruction.  There is no evidence of chest wall recurrence.  Both axillae are benign.  LAB RESULTS:  CMP     Component Value Date/Time   NA 139 04/20/2018 1010   NA 140 08/24/2015 1444   K 3.6 04/20/2018 1010   K 3.5 08/24/2015 1444   CL 102 04/20/2018 1010   CO2 31 04/20/2018 1010   CO2 24 08/24/2015 1444   GLUCOSE 78 04/20/2018 1010   GLUCOSE 147 (H) 08/24/2015 1444   BUN 20 04/20/2018 1010   BUN 13.9 08/24/2015 1444   CREATININE 0.88 04/20/2018 1010   CREATININE 0.9 08/24/2015 1444   CALCIUM 9.1 04/20/2018 1010   CALCIUM 9.3 08/24/2015 1444   PROT 6.4 04/20/2018 1010   PROT 7.6 08/24/2015 1444   ALBUMIN 3.8 01/13/2018 1139   ALBUMIN 3.8 08/24/2015 1444   AST 18 04/20/2018 1010   AST 36 (H) 08/24/2015 1444   ALT 10 04/20/2018 1010   ALT 64 (H) 08/24/2015 1444   ALKPHOS 82 01/13/2018 1139   ALKPHOS 110 08/24/2015 1444   BILITOT 0.5 04/20/2018 1010   BILITOT 0.43 08/24/2015 1444   GFRNONAA 71 04/20/2018 1010   GFRAA 82 04/20/2018 1010    INo results found for: SPEP, UPEP  Lab Results  Component Value Date   WBC 5.6 06/03/2018   NEUTROABS 3.1 06/03/2018   HGB 12.2 06/03/2018   HCT 36.9 06/03/2018   MCV 93.9 06/03/2018   PLT 241 06/03/2018      Chemistry      Component Value Date/Time   NA 139 04/20/2018 1010   NA 140 08/24/2015 1444   K 3.6 04/20/2018 1010   K 3.5 08/24/2015 1444   CL 102 04/20/2018 1010   CO2 31 04/20/2018 1010   CO2 24 08/24/2015 1444   BUN 20  04/20/2018 1010   BUN 13.9  08/24/2015 1444   CREATININE 0.88 04/20/2018 1010   CREATININE 0.9 08/24/2015 1444      Component Value Date/Time   CALCIUM 9.1 04/20/2018 1010   CALCIUM 9.3 08/24/2015 1444   ALKPHOS 82 01/13/2018 1139   ALKPHOS 110 08/24/2015 1444   AST 18 04/20/2018 1010   AST 36 (H) 08/24/2015 1444   ALT 10 04/20/2018 1010   ALT 64 (H) 08/24/2015 1444   BILITOT 0.5 04/20/2018 1010   BILITOT 0.43 08/24/2015 1444       No results found for: LABCA2  No components found for: LABCA125  No results for input(s): INR in the last 168 hours.  Urinalysis    Component Value Date/Time   COLORURINE YELLOW 04/25/2010 2211   APPEARANCEUR CLEAR 04/25/2010 2211   LABSPEC >1.030 (H) 04/25/2010 2211   PHURINE 6.0 04/25/2010 2211   GLUCOSEU NEGATIVE 04/25/2010 2211   GLUCOSEU NEGATIVE 12/30/2007 1520   HGBUR TRACE (A) 04/25/2010 2211   BILIRUBINUR NEGATIVE 04/25/2010 2211   KETONESUR TRACE (A) 04/25/2010 2211   PROTEINUR TRACE (A) 04/25/2010 2211   UROBILINOGEN 0.2 04/25/2010 2211   NITRITE NEGATIVE 04/25/2010 2211   LEUKOCYTESUR NEGATIVE 04/25/2010 2211    STUDIES: No results found.  ASSESSMENT: 62 y.o. [place], [place] woman status post left breast upper outer quadrant biopsy 05/31/2015 for a clinical T1b N0, stage IA  Invasive ductal carcinoma, low to intermediate grade, estrogen and progesterone receptor positive, with HER-2 not amplified, and an MIB-1 of 8%  (1) status post bilateral mastectomies and left sided sentinel lymph node sampling 07/03/2015, showing  (a) on the right, no atypia or malignancy  (b) on the left, a pT2 pN0, stage IIA Invasive ductal carcinoma, grade 2 repeat HER-2 again negative   (2) Oncotype score of 21 predicts a 10 year risk of recurrence outside the breast of 14% if the patient's only systemic therapy is tamoxifen for 5 years.  (a) after appropriate discussion the patient opted against chemotherapy given the very marginal benefit predicted  (3) anastrozole  started October 2016--discontinued December 2018 with cognitive dysfunction  (a) bone density 11/09/2015 was normal  (b) repeat bone density 11/10/2017 shows a T score of -1.3  (4) tamoxifen started 01/13/2018--discontinued June 2019 with concerns re cognitive dysfunction  PLAN: Jaydah is now just about 3 years out from definitive surgery for breast cancer with no evidence of disease recurrence.  This is very favorable.  She has not been able to tolerate an aromatase inhibitor or tamoxifen.  If she lived a bill closer we might consider fulvestrant.  The other option of course would be exemestane.  After discussing all this she is more comfortable with observation alone.  She just wants to be as normal as possible.  We talked about things she can do to improve her cognitive issues and 1 of them is exercise.  Likely she is doing some running now and that is helpful  She will see me again in January.  From that point we may consider going to once a year visits  She knows to call for any other issues that may develop before the next visit.    Magrinat, Virgie Dad, MD  06/03/18 1:56 PM Medical Oncology and Hematology [facility] [address] [place], [place] [address] Tel. [phone]    Fax. [phone]    I, Soijett Blue am acting as scribe for Dr. Sarajane Jews C. Magrinat.  Lindie Spruce MD, have reviewed the above  documentation for accuracy and completeness, and I agree with the above.

## 2018-06-03 ENCOUNTER — Telehealth: Payer: Self-pay | Admitting: Oncology

## 2018-06-03 ENCOUNTER — Inpatient Hospital Stay: Payer: BLUE CROSS/BLUE SHIELD | Attending: Oncology | Admitting: Oncology

## 2018-06-03 ENCOUNTER — Inpatient Hospital Stay: Payer: BLUE CROSS/BLUE SHIELD

## 2018-06-03 VITALS — BP 157/74 | HR 54 | Temp 98.0°F | Resp 18 | Ht 64.75 in | Wt 135.8 lb

## 2018-06-03 DIAGNOSIS — Z800 Family history of malignant neoplasm of digestive organs: Secondary | ICD-10-CM | POA: Diagnosis not present

## 2018-06-03 DIAGNOSIS — E119 Type 2 diabetes mellitus without complications: Secondary | ICD-10-CM

## 2018-06-03 DIAGNOSIS — Z87891 Personal history of nicotine dependence: Secondary | ICD-10-CM

## 2018-06-03 DIAGNOSIS — C50412 Malignant neoplasm of upper-outer quadrant of left female breast: Secondary | ICD-10-CM

## 2018-06-03 DIAGNOSIS — Z170 Estrogen receptor positive status [ER+]: Principal | ICD-10-CM

## 2018-06-03 DIAGNOSIS — Z853 Personal history of malignant neoplasm of breast: Secondary | ICD-10-CM

## 2018-06-03 DIAGNOSIS — I10 Essential (primary) hypertension: Secondary | ICD-10-CM

## 2018-06-03 DIAGNOSIS — Z9013 Acquired absence of bilateral breasts and nipples: Secondary | ICD-10-CM

## 2018-06-03 LAB — COMPREHENSIVE METABOLIC PANEL
ALBUMIN: 4.1 g/dL (ref 3.5–5.0)
ALK PHOS: 87 U/L (ref 38–126)
ALT: 14 U/L (ref 0–44)
AST: 24 U/L (ref 15–41)
Anion gap: 9 (ref 5–15)
BILIRUBIN TOTAL: 0.4 mg/dL (ref 0.3–1.2)
BUN: 13 mg/dL (ref 8–23)
CALCIUM: 9.5 mg/dL (ref 8.9–10.3)
CO2: 29 mmol/L (ref 22–32)
Chloride: 104 mmol/L (ref 98–111)
Creatinine, Ser: 0.93 mg/dL (ref 0.44–1.00)
GFR calc Af Amer: >60 mL/min (ref >60)
Glucose, Bld: 80 mg/dL (ref 70–99)
Potassium: 3.8 mmol/L (ref 3.5–5.1)
Sodium: 142 mmol/L (ref 135–145)
TOTAL PROTEIN: 7.5 g/dL (ref 6.5–8.1)

## 2018-06-03 LAB — CBC WITH DIFFERENTIAL/PLATELET
BASOS ABS: 0.1 10*3/uL (ref 0.0–0.1)
BASOS PCT: 1 %
EOS ABS: 0.1 10*3/uL (ref 0.0–0.5)
EOS PCT: 1 %
HCT: 36.9 % (ref 34.8–46.6)
Hemoglobin: 12.2 g/dL (ref 11.6–15.9)
Lymphocytes Relative: 34 %
Lymphs Abs: 1.9 10*3/uL (ref 0.9–3.3)
MCH: 31.1 pg (ref 25.1–34.0)
MCHC: 33.1 g/dL (ref 31.5–36.0)
MCV: 93.9 fL (ref 79.5–101.0)
MONO ABS: 0.5 10*3/uL (ref 0.1–0.9)
Monocytes Relative: 9 %
Neutro Abs: 3.1 10*3/uL (ref 1.5–6.5)
Neutrophils Relative %: 55 %
PLATELETS: 241 10*3/uL (ref 145–400)
RBC: 3.93 MIL/uL (ref 3.70–5.45)
RDW: 13.5 % (ref 11.2–14.5)
WBC: 5.6 10*3/uL (ref 3.9–10.3)

## 2018-06-03 NOTE — Telephone Encounter (Signed)
Gave avs and calendar ° °

## 2018-06-11 ENCOUNTER — Other Ambulatory Visit: Payer: Self-pay | Admitting: Family Medicine

## 2018-06-11 ENCOUNTER — Encounter: Payer: Self-pay | Admitting: Family Medicine

## 2018-06-11 DIAGNOSIS — Z1211 Encounter for screening for malignant neoplasm of colon: Secondary | ICD-10-CM

## 2018-06-12 ENCOUNTER — Encounter: Payer: Self-pay | Admitting: Family Medicine

## 2018-06-12 ENCOUNTER — Other Ambulatory Visit: Payer: Self-pay | Admitting: Family Medicine

## 2018-06-12 MED ORDER — OLMESARTAN MEDOXOMIL 20 MG PO TABS: ORAL_TABLET | ORAL | 11 refills | Status: DC

## 2018-06-16 ENCOUNTER — Encounter: Payer: Self-pay | Admitting: Family Medicine

## 2018-06-23 ENCOUNTER — Encounter: Payer: Self-pay | Admitting: Oncology

## 2018-07-29 ENCOUNTER — Other Ambulatory Visit: Payer: Self-pay

## 2018-07-29 MED ORDER — OLMESARTAN MEDOXOMIL 20 MG PO TABS: ORAL_TABLET | ORAL | 11 refills | Status: DC

## 2018-07-30 ENCOUNTER — Encounter: Payer: Self-pay | Admitting: Family Medicine

## 2018-07-31 ENCOUNTER — Encounter: Payer: Self-pay | Admitting: Family Medicine

## 2018-08-24 ENCOUNTER — Ambulatory Visit: Payer: BLUE CROSS/BLUE SHIELD | Admitting: Family Medicine

## 2018-08-25 ENCOUNTER — Encounter: Payer: Self-pay | Admitting: Family Medicine

## 2018-09-14 ENCOUNTER — Encounter: Payer: Self-pay | Admitting: Family Medicine

## 2018-09-15 ENCOUNTER — Telehealth: Payer: Self-pay

## 2018-09-15 DIAGNOSIS — E785 Hyperlipidemia, unspecified: Secondary | ICD-10-CM

## 2018-09-15 DIAGNOSIS — I10 Essential (primary) hypertension: Secondary | ICD-10-CM

## 2018-09-15 NOTE — Telephone Encounter (Signed)
Labs mailed to patient.

## 2018-09-18 ENCOUNTER — Encounter: Payer: Self-pay | Admitting: Family Medicine

## 2018-09-21 ENCOUNTER — Encounter: Payer: Self-pay | Admitting: Family Medicine

## 2018-09-21 ENCOUNTER — Ambulatory Visit (INDEPENDENT_AMBULATORY_CARE_PROVIDER_SITE_OTHER): Payer: BLUE CROSS/BLUE SHIELD | Admitting: Family Medicine

## 2018-09-21 VITALS — BP 130/80 | HR 80 | Resp 15 | Ht 64.75 in | Wt 139.0 lb

## 2018-09-21 DIAGNOSIS — F5104 Psychophysiologic insomnia: Secondary | ICD-10-CM | POA: Diagnosis not present

## 2018-09-21 DIAGNOSIS — Z23 Encounter for immunization: Secondary | ICD-10-CM

## 2018-09-21 DIAGNOSIS — E785 Hyperlipidemia, unspecified: Secondary | ICD-10-CM

## 2018-09-21 DIAGNOSIS — I10 Essential (primary) hypertension: Secondary | ICD-10-CM

## 2018-09-21 DIAGNOSIS — F418 Other specified anxiety disorders: Secondary | ICD-10-CM

## 2018-09-21 MED ORDER — BUSPIRONE HCL 5 MG PO TABS: ORAL_TABLET | ORAL | 1 refills | Status: DC

## 2018-09-21 NOTE — Patient Instructions (Addendum)
F/U in 5.5 months, call if you need me before  Flu vaccine today Blood pressure is at goal Please continue medication  It is important that you exercise regularly at least 30 minutes 7 times a week. If you develop chest pain, have severe difficulty breathing, or feel very tired, stop exercising immediately and seek medical attention    New for sleep is BUSPAR 5 mg one every night to help with anxiety  Taper off of alprazolam, take HALF tablet every night for one week, then half tablet every other night for 2nd week, then stop  You do need your pap

## 2018-09-21 NOTE — Progress Notes (Signed)
   Jennifer [name]     MRN: [id number]      DOB: Oct 01, 1956   HPI [name] is here for follow up and re-evaluation of chronic medical conditions, medication management and review of any available recent lab and radiology data.  Preventive health is updated, specifically  Cancer screening and Immunization.   Has upcoming colonoscopy Has request that I write a letter stating that her health is excellent as far as health insurgance/ personal insurance is concerned. I advise that with her dx of breast cancer , I would defer that decision C/o insomnia, wants to get off xanax and take something for sleep  ROS Denies recent fever or chills. Denies sinus pressure, nasal congestion, ear pain or sore throat. Denies chest congestion, productive cough or wheezing. Denies chest pains, palpitations and leg swelling Denies abdominal pain, nausea, vomiting,diarrhea or constipation.   Denies dysuria, frequency, hesitancy or incontinence. Denies joint pain, swelling and limitation in mobility. Denies headaches, seizures, numbness, or tingling. C/o very poor sex drive, states her relationship could be better, also c/o insomnia. Denies skin break down or rash.   PE  BP 130/80   Pulse 80   Resp 15   Ht 5' 4.75" (1.645 m)   Wt 139 lb (63 kg)   SpO2 99%   BMI 23.31 kg/m    Patient alert and oriented and in no cardiopulmonary distress.  HEENT: No facial asymmetry, EOMI,   oropharynx pink and moist.  Neck supple no JVD, no mass.  Chest: Clear to auscultation bilaterally.  CVS: S1, S2 no murmurs, no S3.Regular rate.  ABD: Soft non tender.   Ext: No edema  MS: Adequate ROM spine, shoulders, hips and knees.  Skin: Intact, no ulcerations or rash noted.  Psych: Good eye contact, normal affect. Memory intact not anxious or depressed appearing.  CNS: CN 2-12 intact, power,  normal throughout.no focal deficits noted.   Assessment & Plan  Essential hypertension Controlled, no change in  medication DASH diet and commitment to daily physical activity for a minimum of 30 minutes discussed and encouraged, as a part of hypertension management. The importance of attaining a healthy weight is also discussed.  BP/Weight 09/21/2018 06/03/2018 05/21/2018 01/13/2018 10/09/2017 06/03/2017 4/92/0100  Systolic BP 712 197 588 325 498 264 158  Diastolic BP 80 74 90 72 84 55 74  Wt. (Lbs) 139 135.8 135 134.8 136 140.8 139  BMI 23.31 22.77 22.64 21.76 21.95 22.73 22.44       Insomnia Sleep hygiene reviewed and written information offered also.Currently uncontrolled  Prescription sent for  medication needed.   Depression with anxiety Reports anhedonia and some strain in her relationship currently, I encourage her to discuss with gyne as well as to reconsider therapy  Hyperlipidemia LDL goal <100 Hyperlipidemia:Low fat diet discussed and encouraged.   Lipid Panel  Lab Results  Component Value Date   CHOL 214 (H) 04/20/2018   HDL 76 04/20/2018   LDLCALC 117 (H) 04/20/2018   LDLDIRECT 201 (H) 05/21/2013   TRIG 100 04/20/2018   CHOLHDL 2.8 04/20/2018  needs to reduce fried and fatty foods, not at goal

## 2018-09-25 ENCOUNTER — Encounter: Payer: Self-pay | Admitting: Family Medicine

## 2018-09-25 NOTE — Assessment & Plan Note (Signed)
Hyperlipidemia:Low fat diet discussed and encouraged.   Lipid Panel  Lab Results  Component Value Date   CHOL 214 (H) 04/20/2018   HDL 76 04/20/2018   LDLCALC 117 (H) 04/20/2018   LDLDIRECT 201 (H) 05/21/2013   TRIG 100 04/20/2018   CHOLHDL 2.8 04/20/2018  needs to reduce fried and fatty foods, not at goal

## 2018-09-25 NOTE — Assessment & Plan Note (Signed)
Controlled, no change in medication DASH diet and commitment to daily physical activity for a minimum of 30 minutes discussed and encouraged, as a part of hypertension management. The importance of attaining a healthy weight is also discussed.  BP/Weight 09/21/2018 06/03/2018 05/21/2018 01/13/2018 10/09/2017 06/03/2017 01/16/6577  Systolic BP 469 629 528 413 244 010 272  Diastolic BP 80 74 90 72 84 55 74  Wt. (Lbs) 139 135.8 135 134.8 136 140.8 139  BMI 23.31 22.77 22.64 21.76 21.95 22.73 22.44

## 2018-09-25 NOTE — Assessment & Plan Note (Signed)
Reports anhedonia and some strain in her relationship currently, I encourage her to discuss with gyne as well as to reconsider therapy

## 2018-09-25 NOTE — Assessment & Plan Note (Addendum)
Sleep hygiene reviewed and written information offered also.Currently uncontrolled  Prescription sent for  medication needed.

## 2018-10-14 ENCOUNTER — Encounter: Payer: Self-pay | Admitting: Family Medicine

## 2018-10-14 DIAGNOSIS — K640 First degree hemorrhoids: Secondary | ICD-10-CM | POA: Diagnosis not present

## 2018-10-14 DIAGNOSIS — Z800 Family history of malignant neoplasm of digestive organs: Secondary | ICD-10-CM | POA: Diagnosis not present

## 2018-10-14 DIAGNOSIS — K5730 Diverticulosis of large intestine without perforation or abscess without bleeding: Secondary | ICD-10-CM | POA: Diagnosis not present

## 2018-10-14 DIAGNOSIS — D122 Benign neoplasm of ascending colon: Secondary | ICD-10-CM | POA: Diagnosis not present

## 2018-10-21 ENCOUNTER — Other Ambulatory Visit: Payer: Self-pay

## 2018-10-21 MED ORDER — TRAZODONE HCL 100 MG PO TABS: 100.0000 mg | ORAL_TABLET | Freq: Every day | ORAL | 5 refills | Status: DC

## 2018-10-27 DIAGNOSIS — S93602A Unspecified sprain of left foot, initial encounter: Secondary | ICD-10-CM | POA: Diagnosis not present

## 2018-10-27 DIAGNOSIS — S93402A Sprain of unspecified ligament of left ankle, initial encounter: Secondary | ICD-10-CM | POA: Diagnosis not present

## 2018-10-27 DIAGNOSIS — M7660 Achilles tendinitis, unspecified leg: Secondary | ICD-10-CM | POA: Diagnosis not present

## 2018-11-10 ENCOUNTER — Other Ambulatory Visit: Payer: Self-pay

## 2018-11-10 MED ORDER — BUPROPION HCL ER (XL) 300 MG PO TB24: 300.0000 mg | ORAL_TABLET | Freq: Every morning | ORAL | 1 refills | Status: DC

## 2018-11-18 ENCOUNTER — Other Ambulatory Visit: Payer: Self-pay

## 2018-11-18 MED ORDER — AMLODIPINE BESYLATE 5 MG PO TABS: 5.0000 mg | ORAL_TABLET | Freq: Every day | ORAL | 5 refills | Status: DC

## 2018-11-23 ENCOUNTER — Encounter: Payer: Self-pay | Admitting: Family Medicine

## 2018-11-24 ENCOUNTER — Other Ambulatory Visit: Payer: Self-pay

## 2018-11-24 MED ORDER — TRAZODONE HCL 100 MG PO TABS: 100.0000 mg | ORAL_TABLET | Freq: Every day | ORAL | 0 refills | Status: DC

## 2018-11-24 MED ORDER — BUSPIRONE HCL 5 MG PO TABS: ORAL_TABLET | ORAL | 1 refills | Status: DC

## 2018-11-24 MED ORDER — BUPROPION HCL ER (XL) 300 MG PO TB24: 300.0000 mg | ORAL_TABLET | Freq: Every morning | ORAL | 1 refills | Status: DC

## 2018-11-24 MED ORDER — OLMESARTAN MEDOXOMIL 20 MG PO TABS: ORAL_TABLET | ORAL | 2 refills | Status: DC

## 2018-11-24 MED ORDER — AMLODIPINE BESYLATE 5 MG PO TABS: 5.0000 mg | ORAL_TABLET | Freq: Every day | ORAL | 2 refills | Status: DC
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
## 2018-12-07 ENCOUNTER — Inpatient Hospital Stay: Payer: BLUE CROSS/BLUE SHIELD | Attending: Oncology

## 2018-12-07 ENCOUNTER — Inpatient Hospital Stay (HOSPITAL_BASED_OUTPATIENT_CLINIC_OR_DEPARTMENT_OTHER): Payer: BLUE CROSS/BLUE SHIELD | Admitting: Oncology

## 2018-12-07 VITALS — BP 131/65 | HR 69 | Temp 97.7°F | Resp 18 | Ht 64.75 in | Wt 137.5 lb

## 2018-12-07 DIAGNOSIS — R419 Unspecified symptoms and signs involving cognitive functions and awareness: Secondary | ICD-10-CM | POA: Insufficient documentation

## 2018-12-07 DIAGNOSIS — N6489 Other specified disorders of breast: Secondary | ICD-10-CM

## 2018-12-07 DIAGNOSIS — Z853 Personal history of malignant neoplasm of breast: Secondary | ICD-10-CM | POA: Diagnosis not present

## 2018-12-07 DIAGNOSIS — Z170 Estrogen receptor positive status [ER+]: Principal | ICD-10-CM

## 2018-12-07 DIAGNOSIS — K5900 Constipation, unspecified: Secondary | ICD-10-CM

## 2018-12-07 DIAGNOSIS — Z9013 Acquired absence of bilateral breasts and nipples: Secondary | ICD-10-CM

## 2018-12-07 DIAGNOSIS — F331 Major depressive disorder, recurrent, moderate: Secondary | ICD-10-CM | POA: Diagnosis not present

## 2018-12-07 DIAGNOSIS — I10 Essential (primary) hypertension: Secondary | ICD-10-CM

## 2018-12-07 DIAGNOSIS — F438 Other reactions to severe stress: Secondary | ICD-10-CM | POA: Diagnosis not present

## 2018-12-07 DIAGNOSIS — F411 Generalized anxiety disorder: Secondary | ICD-10-CM | POA: Diagnosis not present

## 2018-12-07 DIAGNOSIS — C50412 Malignant neoplasm of upper-outer quadrant of left female breast: Secondary | ICD-10-CM

## 2018-12-07 LAB — CBC WITH DIFFERENTIAL/PLATELET
Abs Immature Granulocytes: 0.02 10*3/uL (ref 0.00–0.07)
BASOS PCT: 0 %
Basophils Absolute: 0 10*3/uL (ref 0.0–0.1)
EOS ABS: 0 10*3/uL (ref 0.0–0.5)
Eosinophils Relative: 0 %
HCT: 36.3 % (ref 36.0–46.0)
Hemoglobin: 11.5 g/dL — ABNORMAL LOW (ref 12.0–15.0)
Immature Granulocytes: 0 %
Lymphocytes Relative: 24 %
Lymphs Abs: 1.9 10*3/uL (ref 0.7–4.0)
MCH: 30.7 pg (ref 26.0–34.0)
MCHC: 31.7 g/dL (ref 30.0–36.0)
MCV: 96.8 fL (ref 80.0–100.0)
Monocytes Absolute: 0.4 10*3/uL (ref 0.1–1.0)
Monocytes Relative: 5 %
NRBC: 0 % (ref 0.0–0.2)
Neutro Abs: 5.8 10*3/uL (ref 1.7–7.7)
Neutrophils Relative %: 71 %
Platelets: 269 10*3/uL (ref 150–400)
RBC: 3.75 MIL/uL — ABNORMAL LOW (ref 3.87–5.11)
RDW: 13.1 % (ref 11.5–15.5)
WBC: 8.2 10*3/uL (ref 4.0–10.5)

## 2018-12-07 LAB — COMPREHENSIVE METABOLIC PANEL
ALT: 24 U/L (ref 0–44)
AST: 18 U/L (ref 15–41)
Albumin: 3.7 g/dL (ref 3.5–5.0)
Alkaline Phosphatase: 87 U/L (ref 38–126)
Anion gap: 6 (ref 5–15)
BUN: 18 mg/dL (ref 8–23)
CALCIUM: 9 mg/dL (ref 8.9–10.3)
CO2: 28 mmol/L (ref 22–32)
Chloride: 106 mmol/L (ref 98–111)
Creatinine, Ser: 1.02 mg/dL — ABNORMAL HIGH (ref 0.44–1.00)
GFR, EST NON AFRICAN AMERICAN: 59 mL/min — AB (ref >60)
Glucose, Bld: 152 mg/dL — ABNORMAL HIGH (ref 70–99)
Potassium: 3.8 mmol/L (ref 3.5–5.1)
Sodium: 140 mmol/L (ref 135–145)
Total Bilirubin: 0.3 mg/dL (ref 0.3–1.2)
Total Protein: 6.9 g/dL (ref 6.5–8.1)

## 2019-01-05 DIAGNOSIS — F438 Other reactions to severe stress: Secondary | ICD-10-CM | POA: Diagnosis not present

## 2019-01-05 DIAGNOSIS — F331 Major depressive disorder, recurrent, moderate: Secondary | ICD-10-CM | POA: Diagnosis not present

## 2019-01-05 DIAGNOSIS — F411 Generalized anxiety disorder: Secondary | ICD-10-CM | POA: Diagnosis not present

## 2019-01-11 DIAGNOSIS — L719 Rosacea, unspecified: Secondary | ICD-10-CM | POA: Diagnosis not present

## 2019-01-11 DIAGNOSIS — L729 Follicular cyst of the skin and subcutaneous tissue, unspecified: Secondary | ICD-10-CM | POA: Diagnosis not present

## 2019-01-11 DIAGNOSIS — L219 Seborrheic dermatitis, unspecified: Secondary | ICD-10-CM | POA: Diagnosis not present

## 2019-01-11 DIAGNOSIS — D239 Other benign neoplasm of skin, unspecified: Secondary | ICD-10-CM | POA: Diagnosis not present

## 2019-01-25 DIAGNOSIS — D485 Neoplasm of uncertain behavior of skin: Secondary | ICD-10-CM | POA: Diagnosis not present

## 2019-02-25 ENCOUNTER — Encounter: Payer: Self-pay | Admitting: Family Medicine

## 2019-03-08 ENCOUNTER — Other Ambulatory Visit: Payer: Self-pay

## 2019-03-08 ENCOUNTER — Encounter: Payer: Self-pay | Admitting: Family Medicine

## 2019-03-08 ENCOUNTER — Ambulatory Visit (INDEPENDENT_AMBULATORY_CARE_PROVIDER_SITE_OTHER): Payer: BLUE CROSS/BLUE SHIELD | Admitting: Family Medicine

## 2019-03-08 VITALS — BP 130/65 | Ht 64.75 in | Wt 142.0 lb

## 2019-03-08 DIAGNOSIS — I10 Essential (primary) hypertension: Secondary | ICD-10-CM

## 2019-03-08 DIAGNOSIS — F418 Other specified anxiety disorders: Secondary | ICD-10-CM

## 2019-03-08 DIAGNOSIS — F5104 Psychophysiologic insomnia: Secondary | ICD-10-CM

## 2019-03-08 DIAGNOSIS — E785 Hyperlipidemia, unspecified: Secondary | ICD-10-CM | POA: Diagnosis not present

## 2019-03-08 DIAGNOSIS — E559 Vitamin D deficiency, unspecified: Secondary | ICD-10-CM

## 2019-03-08 MED ORDER — OLMESARTAN MEDOXOMIL 40 MG PO TABS: 40.0000 mg | ORAL_TABLET | Freq: Every day | ORAL | 3 refills | Status: DC

## 2019-03-08 NOTE — Progress Notes (Signed)
Virtual Visit via Telephone Note  I connected with Jennifer [name] on 03/08/19 at  1:40 PM EDT by telephone and verified that I am speaking with the correct person using two identifiers.   I discussed the limitations, risks, security and privacy concerns of performing an evaluation and management service by telephone and the availability of in person appointments. I also discussed with the patient that there may be a patient responsible charge related to this service. The patient expressed understanding and agreed to proceed. I am at the office and the patient is in her home   History of Present Illness: F/u chronic problems, has self quarantined with her partner for over 1 month which is extremely stressful Denies recent fever or chills. Denies sinus pressure, nasal congestion, ear pain or sore throat. Denies chest congestion, productive cough or wheezing. Denies chest pains, palpitations and leg swelling Denies abdominal pain, nausea, vomiting,diarrhea or constipation.   Denies dysuria, frequency, hesitancy or incontinence. Chronic shoulder and upper extremity pain following mastectomy, states pain limits upper extremity strength, and is constant, states would not have had mastectomy had she known that so much pain would ensue Denies headaches, seizures, numbness, or tingling. C/o  depression, anxiety insomnia , now being treated by Psych in [place] Insomnia has improved on current regime Denies skin break down or rash. Chronic pain in  Both axillae often over a 10, states that if she anticipated the extent of pain that would ensue she would never have had the surgery         Observations/Objective: BP 130/65   Ht 5' 4.75" (1.645 m)   Wt 142 lb (64.4 kg)   BMI 23.81 kg/m  Good communication with no confusion and intact memory. Alert and oriented x 3 No signs of respiratory distress during sppech    Assessment and Plan: Essential hypertension Controlled, no change in  medication DASH diet and commitment to daily physical activity for a minimum of 30 minutes discussed and encouraged, as a part of hypertension management. The importance of attaining a healthy weight is also discussed.  BP/Weight 03/08/2019 12/07/2018 09/21/2018 06/03/2018 05/21/2018 01/13/2018 24/23/5361  Systolic BP 443 154 008 676 195 093 267  Diastolic BP 65 65 80 74 90 72 84  Wt. (Lbs) 142 137.5 139 135.8 135 134.8 136  BMI 23.81 23.06 23.31 22.77 22.64 21.76 21.95       Hyperlipidemia LDL goal <100 Hyperlipidemia:Low fat diet discussed and encouraged.   Lipid Panel  Lab Results  Component Value Date   CHOL 214 (H) 04/20/2018   HDL 76 04/20/2018   LDLCALC 117 (H) 04/20/2018   LDLDIRECT 201 (H) 05/21/2013   TRIG 100 04/20/2018   CHOLHDL 2.8 04/20/2018   Updated lab needed, not at goal     Insomnia Improved on current regime, and managed by Psych Sleep hygiene reviewed .   Depression with anxiety Reports slightly improved, but still has significant anxiety and depression, not suicidal or homicidal . Managed by Psychiatry, which is v good    Follow Up Instructions:    I discussed the assessment and treatment plan with the patient. The patient was provided an opportunity to ask questions and all were answered. The patient agreed with the plan and demonstrated an understanding of the instructions.   The patient was advised to call back or seek an in-person evaluation if the symptoms worsen or if the condition fails to improve as anticipated.  I provided 25 minutes of non-face-to-face time during this encounter.  Tula Nakayama, MD

## 2019-03-08 NOTE — Patient Instructions (Addendum)
F/U in 6 months, call if you need me before  No changes in current medication   Thankful that overall there is some improvement, continue to enjoy your grand daughter!  Thanks for choosing [facility], we consider it a privelige to serve you.   Social distancing. Frequent hand washing with soap and water Keeping your hands off of your face.wearing a face mask outside of your home These 3 practices will help to keep both you and your community healthy during this time. Please practice them faithfully!   Feasting lipid, cmp and EGFr, TSH, vit D and CBC to be obtained the week of June 15 or May 03, 2019 , please   Only [place]

## 2019-03-13 ENCOUNTER — Telehealth: Payer: Self-pay | Admitting: Family Medicine

## 2019-03-13 ENCOUNTER — Encounter: Payer: Self-pay | Admitting: Family Medicine

## 2019-03-13 NOTE — Assessment & Plan Note (Signed)
Reports slightly improved, but still has significant anxiety and depression, not suicidal or homicidal . Managed by Psychiatry, which is v good

## 2019-03-13 NOTE — Assessment & Plan Note (Signed)
Controlled, no change in medication DASH diet and commitment to daily physical activity for a minimum of 30 minutes discussed and encouraged, as a part of hypertension management. The importance of attaining a healthy weight is also discussed.  BP/Weight 03/08/2019 12/07/2018 09/21/2018 06/03/2018 05/21/2018 01/13/2018 67/54/4920  Systolic BP 100 712 197 588 325 498 264  Diastolic BP 65 65 80 74 90 72 84  Wt. (Lbs) 142 137.5 139 135.8 135 134.8 136  BMI 23.81 23.06 23.31 22.77 22.64 21.76 21.95

## 2019-03-13 NOTE — Assessment & Plan Note (Signed)
Hyperlipidemia:Low fat diet discussed and encouraged.   Lipid Panel  Lab Results  Component Value Date   CHOL 214 (H) 04/20/2018   HDL 76 04/20/2018   LDLCALC 117 (H) 04/20/2018   LDLDIRECT 201 (H) 05/21/2013   TRIG 100 04/20/2018   CHOLHDL 2.8 04/20/2018   Updated lab needed, not at goal

## 2019-03-13 NOTE — Assessment & Plan Note (Signed)
Improved on current regime, and managed by Psych Sleep hygiene reviewed .

## 2019-03-13 NOTE — Telephone Encounter (Signed)
Please print discharge instruction from /27 visit, this is to be mailed as well as  the labs with directions as written, this was not completed on the day f the visit ?? Please ask!  Thank you

## 2019-03-16 NOTE — Addendum Note (Signed)
Addended by: Eual Fines on: 03/16/2019 08:45 AM   Modules accepted: Orders

## 2019-03-16 NOTE — Telephone Encounter (Signed)
done

## 2019-05-10 ENCOUNTER — Other Ambulatory Visit: Payer: Self-pay | Admitting: Family Medicine

## 2019-05-14 DIAGNOSIS — Z1159 Encounter for screening for other viral diseases: Secondary | ICD-10-CM | POA: Diagnosis not present

## 2019-05-20 ENCOUNTER — Encounter: Payer: Self-pay | Admitting: Family Medicine

## 2019-05-25 ENCOUNTER — Encounter: Payer: Self-pay | Admitting: Family Medicine

## 2019-05-26 ENCOUNTER — Encounter: Payer: Self-pay | Admitting: Family Medicine

## 2019-05-26 ENCOUNTER — Other Ambulatory Visit: Payer: Self-pay

## 2019-05-26 ENCOUNTER — Telehealth (INDEPENDENT_AMBULATORY_CARE_PROVIDER_SITE_OTHER): Payer: BC Managed Care – PPO | Admitting: Family Medicine

## 2019-05-26 VITALS — BP 130/65 | Ht 64.0 in | Wt 142.0 lb

## 2019-05-26 DIAGNOSIS — R6889 Other general symptoms and signs: Secondary | ICD-10-CM

## 2019-05-26 DIAGNOSIS — Z20822 Contact with and (suspected) exposure to covid-19: Secondary | ICD-10-CM

## 2019-05-26 NOTE — Progress Notes (Signed)
Virtual Visit via Video Note   This visit type was conducted due to national recommendations for restrictions regarding the COVID-19 Pandemic (e.g. social distancing) in an effort to limit this patient's exposure and mitigate transmission in our community.  Due to her co-morbid illnesses, this patient is at least at moderate risk for complications without adequate follow up.  This format is felt to be most appropriate for this patient at this time.  All issues noted in this document were discussed and addressed.  A limited physical exam was performed with this format.    Evaluation Performed:  Follow-up visit  Date:  05/26/2019   ID:  Jennifer [name], DOB 01/15/1956, MRN [id number]  Patient Location: Home Provider Location: Office  Location of Patient: Home Location of Provider: Telehealth Consent was obtain for visit to be over via telehealth. I verified that I am speaking with the correct person using two identifiers.  PCP:  Fayrene Helper, MD   Chief Complaint:  Fatigue and not feeling well 3 weeks.  History of Present Illness:    Jennifer [name] is a 63 y.o. female with ongoing fatigue and not feeling well x3 weeks.  She is a patient of Dr. Griffin Dakin and has pertinent history of hypertension, allergies, dyslipidemia, depression, breast cancer.   3 weeks ago she had onset of illness which included a cough, headache, feverish ended up going to a fast med urgent care in [facility] for testing a week after that which would have been July 3 per her.  They did a swab not blood work. She was reported to be negative at that time.  When asked about her exposure she reports she is not been around the bite that has been tested positive for exposure.  But she went to a birthday party where about 15-20 people did not have mask and she did not wear her mask.   Today she presents for my chart visit which is very difficult in assessing her ongoing symptoms fully.  As I am  not able to get a set of vitals.  She reports that her home temperature is currently not a fever in the 99 range.  She reports that she has increased shortness of breath, she has ongoing weakness, cannot taste or smell anything, constant headache, chills, coughing on and off, dry cough, nausea, feverish has been low-grade 99, no energy.  Denies having any vomiting, diarrhea, sore throat, sinus pressure pain.  Due to the nature of the ongoing illness and the increasing fatigue and not feeling well.  Suggest that she goes to the emergency room for assessment of vital signs and oxygenation and possible blood work to assess whether or not she has been or is positive.  The patient does have symptoms concerning for COVID-19 infection (fever, chills, cough, or new shortness of breath).   Past Medical, Surgical, Social History, Allergies, and Medications have been Reviewed.    Past Medical History:  Diagnosis Date  . ALLERGIC RHINITIS   . Anxiety   . Arthritis    "joints; mostly hands, wrists, elbows, knees" (07/04/2015)  . Bell's palsy June 2016   [facility]  . Breast cancer of upper-outer quadrant of left female breast ([place]) 06/02/2015  . Cataract    beginning to form  . Depression   . Edema of both legs    2+ pitting edema  . Frequency of urination   . GERD (gastroesophageal reflux disease)   . Heart  murmur   . Herpes zoster   . Hyperlipidemia   . Hypertension   . Myocardial infarction [facility])    "some indication that I've had a mild MI in my past on my workup for geriatric bypass recently" (07/04/2015)  . Pneumonia    "several times"  . PTSD (post-traumatic stress disorder)    Traumatic insomnia  . Sleep apnea    "mask not registering time I was using it; so insurance company took it back; will start wearing it again" (07/04/2015)  . Type II diabetes mellitus ([place])    Past Surgical History:  Procedure Laterality Date  . ANKLE FRACTURE SURGERY Left 12/23/2008   "pins"   . BARIATRIC SURGERY  2017  . BONE EXOSTOSIS EXCISION  09/12/2011   Procedure: EXOSTOSIS EXCISION;  Surgeon: Marcheta Grammes;  Location: [facility];  Service: Orthopedics;  Laterality: Right;  Retrocalcaneal Exostectomy Right Foot  . BREAST BIOPSY Left 05/2015  . COLONOSCOPY    . FRACTURE SURGERY    . LAPAROSCOPIC CHOLECYSTECTOMY  04/2010  . MASTECTOMY COMPLETE / SIMPLE Right 07/03/2015   PROPHYLACTIC   . MASTECTOMY COMPLETE / SIMPLE W/ SENTINEL NODE BIOPSY Left 07/03/2015  . ORIF TIBIA & FIBULA FRACTURES Left 12/23/2008   "pins"  . SIMPLE MASTECTOMY WITH AXILLARY SENTINEL NODE BIOPSY Left 07/03/2015   Procedure: LEFT TOTAL  MASTECTOMY WITH LEFT  AXILLARY SENTINEL NODE BIOPSY;  Surgeon: Excell Seltzer, MD;  Location: [place];  Service: General;  Laterality: Left;  . TOTAL MASTECTOMY Right 07/03/2015   Procedure: RIGHT TOTAL PROPHYLACTIC MASTECTOMY;  Surgeon: Excell Seltzer, MD;  Location: [place];  Service: General;  Laterality: Right;     Current Meds  Medication Sig  . amLODipine (NORVASC) 5 MG tablet Take 1 tablet (5 mg total) by mouth daily.  Marland Kitchen buPROPion (WELLBUTRIN XL) 300 MG 24 hr tablet TAKE 1 TABLET EVERY MORNING  . busPIRone (BUSPAR) 5 MG tablet Take one tablet at bedtime for anxiety  . cholecalciferol (VITAMIN D) 1000 units tablet Take 1,000 Units by mouth daily.  . fluticasone (FLONASE) 50 MCG/ACT nasal spray Place 1 spray into both nostrils daily.  . magnesium gluconate (MAGONATE) 500 MG tablet Take 500 mg by mouth 2 (two) times daily.  . Methylfol-Algae-B12-Acetylcyst (CEREFOLIN NAC) 6-90.314-2-600 MG TABS Take one tablet once daily every morning  . olmesartan (BENICAR) 40 MG tablet Take 1 tablet (40 mg total) by mouth daily.  . prazosin (MINIPRESS) 1 MG capsule Take 2 capsules at bedtime for sleep  . trazodone (DESYREL) 300 MG tablet Take one at bedtime     Allergies:   Duloxetine, Mushroom extract complex, and Pravastatin   Social History   Tobacco Use  . Smoking  status: Never Smoker  . Smokeless tobacco: Never Used  Substance Use Topics  . Alcohol use: No  . Drug use: No     Family Hx: The patient's family history includes Cancer in her brother and mother; Colon polyps (age of onset: 45) in her sister; Depression in her sister; Esophageal cancer in her brother; Heart disease in her brother; Hyperlipidemia in her brother; Hypertension in her sister. There is no history of Anesthesia problems, Colon cancer, Rectal cancer, or Stomach cancer.  ROS:   Please see the history of present illness.    All other systems reviewed and are negative.   Labs/Other Tests and Data Reviewed:   Recent Labs: 12/07/2018: ALT 24; BUN 18; Creatinine, Ser 1.02; Hemoglobin 11.5; Platelets 269; Potassium 3.8; Sodium 140   Recent Lipid Panel  Lab Results  Component Value Date/Time   CHOL 214 (H) 04/20/2018 10:10 AM   TRIG 100 04/20/2018 10:10 AM   HDL 76 04/20/2018 10:10 AM   CHOLHDL 2.8 04/20/2018 10:10 AM   LDLCALC 117 (H) 04/20/2018 10:10 AM   LDLDIRECT 201 (H) 05/21/2013 08:10 AM    Wt Readings from Last 3 Encounters:  05/26/19 142 lb (64.4 kg)  03/08/19 142 lb (64.4 kg)  12/07/18 137 lb 8 oz (62.4 kg)     Objective:    Vital Signs:  BP 130/65   Ht 5\' 4"  (1.626 m)   Wt 142 lb (64.4 kg)   BMI 24.37 kg/m    GEN:  no acute distress RESPIRATORY:  No shortness of breath noticed during conversation, but conversation was short in nature PSYCH:  Normal affect, mood, good communication  ASSESSMENT & PLAN:    1. Suspected Covid-19 Virus Infection It is question whether or not she does actually have COVID-19 infection.  Due to her ongoing nature of symptoms and worsening of feeling bad in addition to being around people without a mask I have suggested that she goes to the emergency room for more clear assessment of her vital signs to make sure that her blood pressure and oxygenation are within range.  Possibly need for re-swab and or blood work to assess  whether or not she has had exposure and is positive or if had antibodies develop.  Time:   Today, I have spent 10 minutes with the patient with telehealth technology discussing the above problems.     Medication Adjustments/Labs and Tests Ordered: Current medicines are reviewed at length with the patient today.  Concerns regarding medicines are outlined above.   Tests Ordered: No orders of the defined types were placed in this encounter.   Medication Changes: No orders of the defined types were placed in this encounter.   Disposition:  Follow up prn  Signed, Perlie Mayo, NP  05/26/2019 1:32 PM     [facility]

## 2019-05-27 ENCOUNTER — Encounter: Payer: Self-pay | Admitting: Family Medicine

## 2019-05-27 NOTE — Addendum Note (Signed)
Addended by: Eual Fines on: 05/27/2019 01:52 PM   Modules accepted: Orders

## 2019-06-08 DIAGNOSIS — L570 Actinic keratosis: Secondary | ICD-10-CM | POA: Diagnosis not present

## 2019-07-28 DIAGNOSIS — F331 Major depressive disorder, recurrent, moderate: Secondary | ICD-10-CM | POA: Diagnosis not present

## 2019-07-28 DIAGNOSIS — F438 Other reactions to severe stress: Secondary | ICD-10-CM | POA: Diagnosis not present

## 2019-07-28 DIAGNOSIS — F411 Generalized anxiety disorder: Secondary | ICD-10-CM | POA: Diagnosis not present

## 2019-07-29 ENCOUNTER — Other Ambulatory Visit: Payer: Self-pay | Admitting: Family Medicine

## 2019-08-25 DIAGNOSIS — F438 Other reactions to severe stress: Secondary | ICD-10-CM | POA: Diagnosis not present

## 2019-08-25 DIAGNOSIS — F411 Generalized anxiety disorder: Secondary | ICD-10-CM | POA: Diagnosis not present

## 2019-08-25 DIAGNOSIS — F331 Major depressive disorder, recurrent, moderate: Secondary | ICD-10-CM | POA: Diagnosis not present

## 2019-10-22 ENCOUNTER — Encounter: Payer: Self-pay | Admitting: Family Medicine

## 2019-10-22 DIAGNOSIS — F441 Dissociative fugue: Secondary | ICD-10-CM | POA: Diagnosis not present

## 2019-10-22 DIAGNOSIS — F331 Major depressive disorder, recurrent, moderate: Secondary | ICD-10-CM | POA: Diagnosis not present

## 2019-10-22 DIAGNOSIS — F411 Generalized anxiety disorder: Secondary | ICD-10-CM | POA: Diagnosis not present

## 2019-10-22 DIAGNOSIS — F438 Other reactions to severe stress: Secondary | ICD-10-CM | POA: Diagnosis not present

## 2019-11-22 ENCOUNTER — Ambulatory Visit: Payer: BLUE CROSS/BLUE SHIELD | Admitting: Family Medicine

## 2019-11-30 ENCOUNTER — Encounter: Payer: Self-pay | Admitting: Family Medicine

## 2019-12-01 ENCOUNTER — Other Ambulatory Visit: Payer: Self-pay

## 2019-12-01 ENCOUNTER — Ambulatory Visit (INDEPENDENT_AMBULATORY_CARE_PROVIDER_SITE_OTHER): Payer: BC Managed Care – PPO | Admitting: Family Medicine

## 2019-12-01 ENCOUNTER — Encounter: Payer: Self-pay | Admitting: Family Medicine

## 2019-12-01 VITALS — BP 122/80 | Ht 64.0 in | Wt 148.0 lb

## 2019-12-01 DIAGNOSIS — R05 Cough: Secondary | ICD-10-CM | POA: Diagnosis not present

## 2019-12-01 DIAGNOSIS — R059 Cough, unspecified: Secondary | ICD-10-CM

## 2019-12-01 DIAGNOSIS — F418 Other specified anxiety disorders: Secondary | ICD-10-CM | POA: Diagnosis not present

## 2019-12-01 DIAGNOSIS — J019 Acute sinusitis, unspecified: Secondary | ICD-10-CM | POA: Diagnosis not present

## 2019-12-01 MED ORDER — AZITHROMYCIN 250 MG PO TABS: ORAL_TABLET | ORAL | 0 refills | Status: DC

## 2019-12-01 MED ORDER — BENZONATATE 100 MG PO CAPS: 100.0000 mg | ORAL_CAPSULE | Freq: Two times a day (BID) | ORAL | 0 refills | Status: DC | PRN

## 2019-12-01 NOTE — Progress Notes (Signed)
Virtual Visit via Telephone Note  I connected with Jennifer [name] on 12/01/19 at  8:40 AM EST by telephone and verified that I am speaking with the correct person using two identifiers.  Location: Patient: home  Provider: office   I discussed the limitations, risks, security and privacy concerns of performing an evaluation and management service by telephone and the availability of in person appointments. I also discussed with the patient that there may be a patient responsible charge related to this service. The patient expressed understanding and agreed to proceed.   History of Present Illness: Frontal headache with slightly elevated temperature , sinus pressure and drainage, no ear pain c/o  sore throat, does not believe this is covid,states was negative in the Summer and she goes nowhere. C/o cough, no sputum , does have some shortness of breath and headache with cough Pressure in frontal sinus area and teeeth, however unable to blow out nasal drainage   Observations/Objective: BP 122/80   Ht 5\' 4"  (1.626 m)   Wt 148 lb (67.1 kg)   BMI 25.40 kg/m  Good communication with no confusion and intact memory. Alert and oriented x 3 Intermittent cough  during speech    Assessment and Plan:  Sinusitis z pack prescribed  Cough Tessalon perles prescribed for as needed use  Depression with anxiety Controlled, no change in medication    Follow Up Instructions:    I discussed the assessment and treatment plan with the patient. The patient was provided an opportunity to ask questions and all were answered. The patient agreed with the plan and demonstrated an understanding of the instructions.   The patient was advised to call back or seek an in-person evaluation if the symptoms worsen or if the condition fails to improve as anticipated.  I provided 10 minutes of non-face-to-face time during this encounter.   Tula Nakayama, MD

## 2019-12-01 NOTE — Patient Instructions (Signed)
F/u in office with MD in mid October, call if you need me sooner  Thankful that overall you have improved  Azithromycin and tessalon perles are prescribed for your sinus infection and cough  Please ensure you have adequate water intake and get extra  Rest so that you can recover fully   Thanks for choosing [facility], we consider it a privelige to serve you. It is important that you exercise regularly at least 30 minutes 5 times a week. If you develop chest pain, have severe difficulty breathing, or feel very tired, stop exercising immediately and seek medical attention

## 2019-12-03 ENCOUNTER — Encounter: Payer: Self-pay | Admitting: Family Medicine

## 2019-12-03 ENCOUNTER — Other Ambulatory Visit: Payer: Self-pay | Admitting: Family Medicine

## 2019-12-03 MED ORDER — PROMETHAZINE-DM 6.25-15 MG/5ML PO SYRP: ORAL_SOLUTION | ORAL | 0 refills | Status: DC

## 2019-12-03 MED ORDER — PREDNISONE 5 MG PO TABS: 5.0000 mg | ORAL_TABLET | Freq: Two times a day (BID) | ORAL | 0 refills | Status: AC

## 2019-12-05 ENCOUNTER — Encounter: Payer: Self-pay | Admitting: Oncology

## 2019-12-06 ENCOUNTER — Encounter: Payer: Self-pay | Admitting: Family Medicine

## 2019-12-06 DIAGNOSIS — R059 Cough, unspecified: Secondary | ICD-10-CM | POA: Insufficient documentation

## 2019-12-06 DIAGNOSIS — R05 Cough: Secondary | ICD-10-CM | POA: Insufficient documentation

## 2019-12-06 NOTE — Assessment & Plan Note (Signed)
z pack prescribed 

## 2019-12-06 NOTE — Assessment & Plan Note (Signed)
Tessalon perles prescribed for as needed use

## 2019-12-06 NOTE — Assessment & Plan Note (Signed)
Controlled, no change in medication  

## 2019-12-09 ENCOUNTER — Other Ambulatory Visit: Payer: BLUE CROSS/BLUE SHIELD

## 2019-12-09 ENCOUNTER — Ambulatory Visit: Payer: BLUE CROSS/BLUE SHIELD | Admitting: Oncology

## 2020-02-21 DIAGNOSIS — F441 Dissociative fugue: Secondary | ICD-10-CM | POA: Diagnosis not present

## 2020-02-21 DIAGNOSIS — F411 Generalized anxiety disorder: Secondary | ICD-10-CM | POA: Diagnosis not present

## 2020-02-21 DIAGNOSIS — F331 Major depressive disorder, recurrent, moderate: Secondary | ICD-10-CM | POA: Diagnosis not present

## 2020-02-21 DIAGNOSIS — F438 Other reactions to severe stress: Secondary | ICD-10-CM | POA: Diagnosis not present

## 2020-04-07 ENCOUNTER — Other Ambulatory Visit: Payer: Self-pay | Admitting: Family Medicine

## 2020-05-22 DIAGNOSIS — F331 Major depressive disorder, recurrent, moderate: Secondary | ICD-10-CM | POA: Diagnosis not present

## 2020-05-22 DIAGNOSIS — F441 Dissociative fugue: Secondary | ICD-10-CM | POA: Diagnosis not present

## 2020-05-22 DIAGNOSIS — F411 Generalized anxiety disorder: Secondary | ICD-10-CM | POA: Diagnosis not present

## 2020-05-22 DIAGNOSIS — F438 Other reactions to severe stress: Secondary | ICD-10-CM | POA: Diagnosis not present

## 2020-07-23 ENCOUNTER — Other Ambulatory Visit: Payer: Self-pay | Admitting: Family Medicine

## 2020-08-24 ENCOUNTER — Encounter: Payer: Self-pay | Admitting: Family Medicine

## 2020-08-28 ENCOUNTER — Telehealth (INDEPENDENT_AMBULATORY_CARE_PROVIDER_SITE_OTHER): Payer: BC Managed Care – PPO | Admitting: Family Medicine

## 2020-08-28 ENCOUNTER — Encounter: Payer: Self-pay | Admitting: Family Medicine

## 2020-08-28 ENCOUNTER — Other Ambulatory Visit: Payer: Self-pay

## 2020-08-28 VITALS — BP 134/77 | Ht 64.0 in | Wt 156.0 lb

## 2020-08-28 DIAGNOSIS — C50412 Malignant neoplasm of upper-outer quadrant of left female breast: Secondary | ICD-10-CM | POA: Diagnosis not present

## 2020-08-28 DIAGNOSIS — I10 Essential (primary) hypertension: Secondary | ICD-10-CM

## 2020-08-28 DIAGNOSIS — C50912 Malignant neoplasm of unspecified site of left female breast: Secondary | ICD-10-CM | POA: Diagnosis not present

## 2020-08-28 DIAGNOSIS — Z170 Estrogen receptor positive status [ER+]: Secondary | ICD-10-CM

## 2020-08-28 DIAGNOSIS — F418 Other specified anxiety disorders: Secondary | ICD-10-CM | POA: Diagnosis not present

## 2020-08-28 NOTE — Progress Notes (Signed)
Virtual Visit via Telephone Note  I connected with ALVIS PULCINI on 08/28/20 at  1:00 PM EDT by video and verified that I am speaking with the correct person using two identifiers.  Location: Patient: home Provider: office  I discussed the limitations, risks, security and privacy concerns of performing an evaluation and management service by telephone and the availability of in person appointments. I also discussed with the patient that there may be a patient responsible charge related to this service. The patient expressed understanding and agreed to proceed.   History of Present Illness: F/U chronic problems. Reports improved in past 8 montes, more physically active, no pain, wants to re establish with Oncology and still needs pap Mental health excellent on current meds with her Provider No concerns. ROS; Negative    Observations/Objective: BP 134/77   Ht 5\' 4"  (1.626 m)   Wt 156 lb (70.8 kg)   BMI 26.78 kg/m   Good communication with no confusion and intact memory. Alert and oriented x 3 No signs of respiratory distress during speech    Follow Up Instructions: Essential hypertension Controlled, no change in medication DASH diet and commitment to daily physical activity for a minimum of 30 minutes discussed and encouraged, as a part of hypertension management. The importance of attaining a healthy weight is also discussed.  BP/Weight 08/28/2020 12/01/2019 05/26/2019 03/08/2019 12/07/2018 09/21/2018 02/20/8785  Systolic BP 767 209 470 962 836 629 476  Diastolic BP 77 80 65 65 65 80 74  Wt. (Lbs) 156 148 142 142 137.5 139 135.8  BMI 26.78 25.4 24.37 23.81 23.06 23.31 22.77       Depression with anxiety Marked improvement. Treated by Psych and psychology No med change  Malignant neoplasm of upper-outer quadrant of left breast in female, estrogen receptor positive ([place]) Needs and wants to re establish with Oncology. Referral re entered and requested pt go     I discussed  the assessment and treatment plan with the patient. The patient was provided an opportunity to ask questions and all were answered. The patient agreed with the plan and demonstrated an understanding of the instructions.   The patient was advised to call back or seek an in-person evaluation if the symptoms worsen or if the condition fails to improve as anticipated.  I provided *15 minutes of non-face-to-face time during this encounter.   Tula Nakayama, MD

## 2020-08-28 NOTE — Patient Instructions (Addendum)
F/U in office with MD in April, call if you need me before  Please notify us when you get your flu and Moderna vaccines  Please DO GET your pap smear , this is overdue , I know that you will.  Thankful that you are doing so very wekll, keep up the great work  An appointment with Oncology will be arranged, that office will call  Fasting CBC, lipid, cmp and eGFR, tSH, vit d and hBA1Cthis week at [place] please  It is important that you exercise regularly at least 30 minutes 5 times a week. If you develop chest pain, have severe difficulty breathing, or feel very tired, stop exercising immediately and seek medical attention  Think about what you will eat, plan ahead. Choose " clean, green, fresh or frozen" over canned, processed or packaged foods which are more sugary, salty and fatty. 70 to 75% of food eaten should be vegetables and fruit. Three meals at set times with snacks allowed between meals, but they must be fruit or vegetables. Aim to eat over a 12 hour period , example 7 am to 7 pm, and STOP after  your last meal of the day. Drink water,generally about 64 ounces per day, no other drink is as healthy. Fruit juice is best enjoyed in a healthy way, by EATING the fruit. Thanks for choosing [facility], we consider it a privelige to serve you.

## 2020-08-29 ENCOUNTER — Telehealth: Payer: Self-pay | Admitting: Oncology

## 2020-08-29 NOTE — Telephone Encounter (Signed)
Scheduled appointment per 10/18 referral. Called patient. No answer and no voicemail set up. Will call patient again later today.

## 2020-09-03 NOTE — Progress Notes (Signed)
[place]  Telephone:([phone]) [phone] Fax:(336) [phone]    ID: Jennifer [name] DOB: 02-Nov-1956  MR#: [id number]  WVP#:[id number]  Patient Care Team: Fayrene Helper, MD as PCP - General Excell Seltzer, MD (Inactive) as Consulting Physician (General Surgery) Magrinat, Virgie Dad, MD as Consulting Physician (Oncology) Kyung Rudd, MD as Consulting Physician (Radiation Oncology) Mauro Kaufmann, RN as Registered Nurse Rockwell Germany, RN as Registered Nurse Causey, Charlestine Massed, NP as Nurse Practitioner (Hematology and Oncology) OTHER MD:   CHIEF COMPLAINT: Estrogen receptor positive breast cancer (s/p bilateral mastectomies)  CURRENT TREATMENT: Observation   INTERVAL HISTORY: Daniyla was scheduled today for follow-up of her estrogen receptor positive breast cancer. She continues under observation.   Since her last visit here in 11/2018, she has not undergone any additional studies.   REVIEW OF SYSTEMS: Iantha      BREAST CANCER HISTORY: From the original intake note:  Maytal had screening mammography at [facility] 05/25/2015. This showed a spiculated mass in the left breast and the patient was referred to [facility] where on 05/31/2015 she underwent ultrasonography of the left breast. This showed a 1 cm lobulated mass in the left breast superiorly, measuring 1.0 cm. This was felt to be highly suspicious. Accordingly on 05/31/2015 the patient underwent left breast biopsy showing ([id number]) an invasive ductal carcinoma, grade 1 or 2 , estrogen and progesterone receptor positive, with an MIB-1 of 8, and HER-2 not amplified, the signals ratio being 1.33 and the number per cell 2.85.  Her subsequent history is as detailed below   PAST MEDICAL HISTORY: Past Medical History:  Diagnosis Date   ALLERGIC RHINITIS    Anxiety    Arthritis    "joints; mostly hands, wrists, elbows, knees" (07/04/2015)   Bell's palsy June 2016   [facility]    Breast cancer of upper-outer quadrant of left female breast ([place]) 06/02/2015   Cataract    beginning to form   Depression    Edema of both legs    2+ pitting edema   Frequency of urination    GERD (gastroesophageal reflux disease)    Heart murmur    Herpes zoster    Hyperlipidemia    Hypertension    Myocardial infarction [facility])    "some indication that I've had a mild MI in my past on my workup for geriatric bypass recently" (07/04/2015)   Pneumonia    "several times"   PTSD (post-traumatic stress disorder)    Traumatic insomnia   Sleep apnea    "mask not registering time I was using it; so insurance company took it back; will start wearing it again" (07/04/2015)   Type II diabetes mellitus ([place])     PAST SURGICAL HISTORY: Past Surgical History:  Procedure Laterality Date   ANKLE FRACTURE SURGERY Left 12/23/2008   "pins"   BARIATRIC SURGERY  2017   BONE EXOSTOSIS EXCISION  09/12/2011   Procedure: EXOSTOSIS EXCISION;  Surgeon: Marcheta Grammes;  Location: [facility];  Service: Orthopedics;  Laterality: Right;  Retrocalcaneal Exostectomy Right Foot   BREAST BIOPSY Left 05/2015   COLONOSCOPY     FRACTURE SURGERY     LAPAROSCOPIC CHOLECYSTECTOMY  04/2010   MASTECTOMY COMPLETE / SIMPLE Right 07/03/2015   PROPHYLACTIC    MASTECTOMY COMPLETE / SIMPLE W/ SENTINEL NODE BIOPSY Left 07/03/2015   ORIF TIBIA & FIBULA FRACTURES Left 12/23/2008   "pins"   SIMPLE MASTECTOMY WITH AXILLARY SENTINEL NODE BIOPSY Left 07/03/2015   Procedure: LEFT  TOTAL  MASTECTOMY WITH LEFT  AXILLARY SENTINEL NODE BIOPSY;  Surgeon: Excell Seltzer, MD;  Location: [place];  Service: General;  Laterality: Left;   TOTAL MASTECTOMY Right 07/03/2015   Procedure: RIGHT TOTAL PROPHYLACTIC MASTECTOMY;  Surgeon: Excell Seltzer, MD;  Location: [place];  Service: General;  Laterality: Right;    FAMILY HISTORY Family History  Problem Relation Age of Onset   Cancer Mother        lung    Hypertension Sister        x2   Colon polyps Sister 32   Heart disease Brother    Esophageal cancer Brother    Hyperlipidemia Brother    Cancer Brother    Depression Sister    Anesthesia problems Neg Hx    Colon cancer Neg Hx    Rectal cancer Neg Hx    Stomach cancer Neg Hx    The patient's father died in an automobile accident the edge of 83. The patient's mother was diagnosed with lung cancer at the age of 4, shortly before her death from that call us. She was a smoker. The patient had 4 brothers, 2 sisters. One brother was diagnosed with esophageal cancer at age 52. There is no history of breast or ovarian cancer in the family.   GYNECOLOGIC HISTORY:  No LMP recorded. Patient is postmenopausal. Menarche age 64, the patient is GX P0. She stopped having periods approximately age 64. She took hormone replacement approximately one year. 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 She married Lydian Chavous 08/15/2009 in [place]. It is just the 2 of them at home, with 2 dogs. They also foster children at times. They used to live in [place], so they have their medical care in this area. 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 The patient is not a church attender.     ADVANCED DIRECTIVES: In place   HEALTH MAINTENANCE: Social History   Tobacco Use   Smoking status: Never Smoker   Smokeless tobacco: Never Used  Substance Use Topics   Alcohol use: No   Drug use: No     Colonoscopy: 10/14/2018; tubular adenoma  PAP:  Bone density: never   Lipid panel:  Allergies  Allergen Reactions   Hydromorphone     Other reaction(s): Nausea and Vomiting   Duloxetine Other (See Comments)    REACTION: sxual side effects   Mushroom Extract Complex Nausea And Vomiting   Pravastatin Other (See Comments)    Muscle aches    Current Outpatient Medications  Medication Sig  Dispense Refill   ALPRAZolam (XANAX) 0.25 MG tablet Take half tablet by mouth as needed, for panic attacks 20 tablet 0   amLODipine (NORVASC) 5 MG tablet TAKE 1 TABLET DAILY 90 tablet 3   ARIPiprazole (ABILIFY) 2 MG tablet Take 1 tablet (2 mg total) by mouth daily. 30 tablet 3   buPROPion (WELLBUTRIN XL) 300 MG 24 hr tablet TAKE 1 TABLET EVERY MORNING 90 tablet 3   cholecalciferol (VITAMIN D) 1000 units tablet Take 1,000 Units by mouth daily.     fluticasone (FLONASE) 50 MCG/ACT nasal spray Place 1 spray into both nostrils daily.     gabapentin (NEURONTIN) 100 MG capsule Take 100 mg by mouth daily.     magnesium gluconate (MAGONATE) 500 MG tablet Take 500 mg by mouth 2 (two) times daily.     Methylfol-Algae-B12-Acetylcyst (CEREFOLIN NAC) 6-90.314-2-600  MG TABS Take one tablet once daily every morning 30 tablet    mirtazapine (REMERON SOLTAB) 15 MG disintegrating tablet Take 1 tablet (15 mg total) by mouth at bedtime. 30 tablet 3   olmesartan (BENICAR) 40 MG tablet TAKE 1 TABLET DAILY 90 tablet 3   No current facility-administered medications for this visit.    OBJECTIVE:   There were no vitals filed for this visit.   There is no height or weight on file to calculate BMI.    ECOG FS:     LAB RESULTS:  CMP     Component Value Date/Time   NA 140 12/07/2018 1242   NA 140 08/24/2015 1444   K 3.8 12/07/2018 1242   K 3.5 08/24/2015 1444   CL 106 12/07/2018 1242   CO2 28 12/07/2018 1242   CO2 24 08/24/2015 1444   GLUCOSE 152 (H) 12/07/2018 1242   GLUCOSE 147 (H) 08/24/2015 1444   BUN 18 12/07/2018 1242   BUN 13.9 08/24/2015 1444   CREATININE 1.02 (H) 12/07/2018 1242   CREATININE 0.88 04/20/2018 1010   CREATININE 0.9 08/24/2015 1444   CALCIUM 9.0 12/07/2018 1242   CALCIUM 9.3 08/24/2015 1444   PROT 6.9 12/07/2018 1242   PROT 7.6 08/24/2015 1444   ALBUMIN 3.7 12/07/2018 1242   ALBUMIN 3.8 08/24/2015 1444   AST 18 12/07/2018 1242   AST 36 (H) 08/24/2015 1444   ALT 24  12/07/2018 1242   ALT 64 (H) 08/24/2015 1444   ALKPHOS 87 12/07/2018 1242   ALKPHOS 110 08/24/2015 1444   BILITOT 0.3 12/07/2018 1242   BILITOT 0.43 08/24/2015 1444   GFRNONAA 59 (L) 12/07/2018 1242   GFRNONAA 71 04/20/2018 1010   GFRAA >60 12/07/2018 1242   GFRAA 82 04/20/2018 1010    INo results found for: SPEP, UPEP  Lab Results  Component Value Date   WBC 8.2 12/07/2018   NEUTROABS 5.8 12/07/2018   HGB 11.5 (L) 12/07/2018   HCT 36.3 12/07/2018   MCV 96.8 12/07/2018   PLT 269 12/07/2018      Chemistry      Component Value Date/Time   NA 140 12/07/2018 1242   NA 140 08/24/2015 1444   K 3.8 12/07/2018 1242   K 3.5 08/24/2015 1444   CL 106 12/07/2018 1242   CO2 28 12/07/2018 1242   CO2 24 08/24/2015 1444   BUN 18 12/07/2018 1242   BUN 13.9 08/24/2015 1444   CREATININE 1.02 (H) 12/07/2018 1242   CREATININE 0.88 04/20/2018 1010   CREATININE 0.9 08/24/2015 1444      Component Value Date/Time   CALCIUM 9.0 12/07/2018 1242   CALCIUM 9.3 08/24/2015 1444   ALKPHOS 87 12/07/2018 1242   ALKPHOS 110 08/24/2015 1444   AST 18 12/07/2018 1242   AST 36 (H) 08/24/2015 1444   ALT 24 12/07/2018 1242   ALT 64 (H) 08/24/2015 1444   BILITOT 0.3 12/07/2018 1242   BILITOT 0.43 08/24/2015 1444      No results found for: LABCA2  No components found for: LABCA125  No results for input(s): INR in the last 168 hours.  Urinalysis    Component Value Date/Time   COLORURINE YELLOW 04/25/2010 2211   APPEARANCEUR CLEAR 04/25/2010 2211   LABSPEC >1.030 (H) 04/25/2010 2211   PHURINE 6.0 04/25/2010 2211   GLUCOSEU NEGATIVE 04/25/2010 2211   GLUCOSEU NEGATIVE 12/30/2007 [place] (A) 04/25/2010 2211   BILIRUBINUR NEGATIVE 04/25/2010 [place] (A) 04/25/2010 2211  PROTEINUR TRACE (A) 04/25/2010 2211   UROBILINOGEN 0.2 04/25/2010 2211   NITRITE NEGATIVE 04/25/2010 2211   LEUKOCYTESUR NEGATIVE 04/25/2010 2211    STUDIES: No results found.   ASSESSMENT:  64 y.o. [place], [place] woman status post left breast upper outer quadrant biopsy 05/31/2015 for a clinical T1b N0, stage IA  Invasive ductal carcinoma, low to intermediate grade, estrogen and progesterone receptor positive, with HER-2 not amplified, and an MIB-1 of 8%  (1) status post bilateral mastectomies and left sided sentinel lymph node sampling 07/03/2015, showing  (a) on the right, no atypia or malignancy  (b) on the left, a pT2 pN0, stage IIA Invasive ductal carcinoma, grade 2 repeat HER-2 again negative   (2) Oncotype score of 21 predicts a 10 year risk of recurrence outside the breast of 14% if the patient's only systemic therapy is tamoxifen for 5 years.  (a) after appropriate discussion the patient opted against chemotherapy given the very marginal benefit predicted  (3) anastrozole started October 2016--discontinued December 2018 with cognitive dysfunction  (a) bone density 11/09/2015 was normal  (b) repeat bone density 11/10/2017 shows a T score of -1.3  (4) tamoxifen started 01/13/2018--discontinued June 2019 with concerns re cognitive dysfunction   PLAN: Analilia did not show for her 09/04/2020 visit.  A follow-up letter has been sent.  Magrinat, Virgie Dad, MD  09/03/20 10:20 PM Medical Oncology and Hematology [facility] [place], [place] [address] Tel. [phone]    Fax. [phone]    I, Wilburn Mylar, am acting as scribe for Dr. Virgie Dad. Magrinat.     *Total Encounter Time as defined by the Centers for Medicare and Medicaid Services includes, in addition to the face-to-face time of a patient visit (documented in the note above) non-face-to-face time: obtaining and reviewing outside history, ordering and reviewing medications, tests or procedures, care coordination (communications with other health care professionals or caregivers) and documentation in the medical record.

## 2020-09-04 ENCOUNTER — Encounter: Payer: Self-pay | Admitting: Oncology

## 2020-09-04 ENCOUNTER — Encounter: Payer: Self-pay | Admitting: Family Medicine

## 2020-09-04 ENCOUNTER — Inpatient Hospital Stay: Payer: BC Managed Care – PPO | Attending: Oncology | Admitting: Oncology

## 2020-09-04 DIAGNOSIS — Z170 Estrogen receptor positive status [ER+]: Secondary | ICD-10-CM

## 2020-09-04 DIAGNOSIS — C50412 Malignant neoplasm of upper-outer quadrant of left female breast: Secondary | ICD-10-CM

## 2020-09-04 NOTE — Assessment & Plan Note (Signed)
Controlled, no change in medication DASH diet and commitment to daily physical activity for a minimum of 30 minutes discussed and encouraged, as a part of hypertension management. The importance of attaining a healthy weight is also discussed.  BP/Weight 08/28/2020 12/01/2019 05/26/2019 03/08/2019 12/07/2018 09/21/2018 7/91/5056  Systolic BP 979 480 165 537 482 707 867  Diastolic BP 77 80 65 65 65 80 74  Wt. (Lbs) 156 148 142 142 137.5 139 135.8  BMI 26.78 25.4 24.37 23.81 23.06 23.31 22.77

## 2020-09-04 NOTE — Assessment & Plan Note (Signed)
Marked improvement. Treated by Psych and psychology No med change

## 2020-09-04 NOTE — Assessment & Plan Note (Signed)
Needs and wants to re establish with Oncology. Referral re entered and requested pt go

## 2020-09-05 ENCOUNTER — Telehealth: Payer: Self-pay | Admitting: Oncology

## 2020-09-05 NOTE — Telephone Encounter (Signed)
Called patient no voice mail set up

## 2020-09-21 ENCOUNTER — Other Ambulatory Visit: Payer: Self-pay | Admitting: Family Medicine

## 2021-07-20 ENCOUNTER — Other Ambulatory Visit: Payer: Self-pay | Admitting: Family Medicine

## 2021-09-10 ENCOUNTER — Other Ambulatory Visit: Payer: Self-pay | Admitting: Family Medicine

## 2022-09-10 ENCOUNTER — Other Ambulatory Visit: Payer: Self-pay | Admitting: Family Medicine
